# Patient Record
Sex: Female | Born: 1935 | Race: White | Hispanic: No | Smoking: Never smoker
Health system: Southern US, Community
[De-identification: ages and names within clinical notes are randomized; demographics above are authoritative.]

## PROBLEM LIST (undated history)

## (undated) DIAGNOSIS — K649 Unspecified hemorrhoids: Secondary | ICD-10-CM

## (undated) DIAGNOSIS — I10 Essential (primary) hypertension: Secondary | ICD-10-CM

## (undated) HISTORY — PX: TONSILLECTOMY: SUR1361

---

## 2004-06-24 ENCOUNTER — Ambulatory Visit: Payer: Self-pay | Admitting: Cardiology

## 2004-06-26 ENCOUNTER — Ambulatory Visit: Payer: Self-pay | Admitting: Internal Medicine

## 2004-06-30 ENCOUNTER — Ambulatory Visit: Payer: Self-pay | Admitting: Cardiology

## 2004-08-19 ENCOUNTER — Ambulatory Visit: Payer: Self-pay | Admitting: Cardiology

## 2004-08-25 ENCOUNTER — Inpatient Hospital Stay (HOSPITAL_BASED_OUTPATIENT_CLINIC_OR_DEPARTMENT_OTHER): Admission: RE | Admit: 2004-08-25 | Discharge: 2004-08-25 | Payer: Self-pay | Admitting: *Deleted

## 2004-08-25 ENCOUNTER — Ambulatory Visit: Payer: Self-pay | Admitting: Cardiology

## 2004-09-02 ENCOUNTER — Ambulatory Visit: Payer: Self-pay | Admitting: Cardiology

## 2008-05-18 ENCOUNTER — Ambulatory Visit (HOSPITAL_COMMUNITY): Admission: RE | Admit: 2008-05-18 | Discharge: 2008-05-18 | Payer: Self-pay | Admitting: Orthopaedic Surgery

## 2010-10-31 NOTE — Cardiovascular Report (Signed)
NAME:  Latoya Long, Latoya Long NO.:  [id number]   MEDICAL RECORD NO.:  [id number]           PATIENT TYPE:   LOCATION:                               FACILITY:  [facility]   PHYSICIAN:  Learta Codding, M.D. LHCDATE OF BIRTH:  1928-02-24   DATE OF PROCEDURE:  12/18/2004  DATE OF DISCHARGE:                              CARDIAC CATHETERIZATION   PROCEDURES:  1.  Left heart catheterization with selective coronary angiography.  2.  Ventriculography.   DIAGNOSIS:  No evidence of significant flow-limiting epicardial coronary  artery disease.   INDICATIONS:  The patient is a 77 year old female with a history of an  abnormal Cardiolite study. The patient has ongoing substernal chest pain and  has been admitted for diagnostic heart catheterization.   DESCRIPTION OF PROCEDURE:  After informed consent was obtained, the patient  was brought to the CV Catheter Lab. Right groin was sterilely prepped and  draped. A 4-French arterial sheath was placed using modified Seldinger  technique. Selective coronary angiography was performed in various  projections using manual injection of contrast. A 4-French pigtail catheter  was used for ventriculography. Power injection was used for  ventriculography. At the termination of the procedure, all catheter sheaths  were removed and no complications were encountered.   HEMODYNAMIC DATA:  1.  Left ventricular pressure 180/5 mmHg.  2.  Aortic pressure 180/85 mmHg.  3.  There was no gradient on coronary artery pullback.   VENTRICULOGRAPHY:  Ejection fraction 65% with segmental wall motion  abnormality in single plane RAO projection.   SELECTIVE CORONARY ANGIOGRAPHY:  1.  Left main coronary artery was a large caliber vessel with no evidence of      flow-limiting disease.  2.  LAD and the diagonal branch were free of significant flow-limiting      epicardial coronary artery disease.  3.  Circumflex coronary artery was a large caliber vessel. No  flow-limiting      lesions were seen and the marginal branches also had no focal stenoses.  4.  Right coronary artery again was a large caliber vessel terminating in a      PDA and a posterolateral branch. There were no flow-limiting lesions      noted.   RECOMMENDATIONS:  No evidence of significant flow-limiting epicardial  coronary artery disease. The patient appears to have a false-positive  Cardiolite stress study. Continue medical therapy, particularly with focus  on blood pressure control.       GED/MEDQ  D:  12/18/2004  T:  12/18/2004  Job:  [id number]

## 2013-11-06 ENCOUNTER — Emergency Department (HOSPITAL_COMMUNITY): Payer: Medicare HMO

## 2013-11-06 ENCOUNTER — Other Ambulatory Visit: Payer: Self-pay

## 2013-11-06 ENCOUNTER — Emergency Department (HOSPITAL_COMMUNITY)
Admission: EM | Admit: 2013-11-06 | Discharge: 2013-11-06 | Disposition: A | Discharge status: Home / Self Care | Payer: Medicare HMO | Attending: Emergency Medicine | Admitting: Emergency Medicine

## 2013-11-06 ENCOUNTER — Encounter (HOSPITAL_COMMUNITY): Payer: Self-pay | Admitting: Emergency Medicine

## 2013-11-06 DIAGNOSIS — Y9389 Activity, other specified: Secondary | ICD-10-CM | POA: Insufficient documentation

## 2013-11-06 DIAGNOSIS — W1809XA Striking against other object with subsequent fall, initial encounter: Secondary | ICD-10-CM | POA: Insufficient documentation

## 2013-11-06 DIAGNOSIS — I10 Essential (primary) hypertension: Secondary | ICD-10-CM | POA: Insufficient documentation

## 2013-11-06 DIAGNOSIS — W19XXXA Unspecified fall, initial encounter: Secondary | ICD-10-CM

## 2013-11-06 DIAGNOSIS — Y929 Unspecified place or not applicable: Secondary | ICD-10-CM | POA: Insufficient documentation

## 2013-11-06 DIAGNOSIS — S39011A Strain of muscle, fascia and tendon of abdomen, initial encounter: Secondary | ICD-10-CM

## 2013-11-06 DIAGNOSIS — S0990XA Unspecified injury of head, initial encounter: Secondary | ICD-10-CM | POA: Insufficient documentation

## 2013-11-06 DIAGNOSIS — IMO0002 Reserved for concepts with insufficient information to code with codable children: Secondary | ICD-10-CM | POA: Insufficient documentation

## 2013-11-06 HISTORY — DX: Essential (primary) hypertension: I10

## 2013-11-06 LAB — COMPREHENSIVE METABOLIC PANEL WITH GFR
ALT: 12 U/L (ref 0–35)
AST: 19 U/L (ref 0–37)
Albumin: 3.6 g/dL (ref 3.5–5.2)
Alkaline Phosphatase: 92 U/L (ref 39–117)
BUN: 16 mg/dL (ref 6–23)
CO2: 28 meq/L (ref 19–32)
Calcium: 9.1 mg/dL (ref 8.4–10.5)
Chloride: 100 meq/L (ref 96–112)
Creatinine, Ser: 0.74 mg/dL (ref 0.50–1.10)
GFR calc Af Amer: 89 mL/min — ABNORMAL LOW
GFR calc non Af Amer: 77 mL/min — ABNORMAL LOW
Glucose, Bld: 103 mg/dL — ABNORMAL HIGH (ref 70–99)
Potassium: 4.4 meq/L (ref 3.7–5.3)
Sodium: 138 meq/L (ref 137–147)
Total Bilirubin: 0.4 mg/dL (ref 0.3–1.2)
Total Protein: 7.2 g/dL (ref 6.0–8.3)

## 2013-11-06 LAB — CBC WITH DIFFERENTIAL/PLATELET
BASOS PCT: 0 % (ref 0–1)
Basophils Absolute: 0 10*3/uL (ref 0.0–0.1)
EOS ABS: 0.1 10*3/uL (ref 0.0–0.7)
EOS PCT: 2 % (ref 0–5)
HCT: 44.5 % (ref 36.0–46.0)
Hemoglobin: 14.5 g/dL (ref 12.0–15.0)
Lymphocytes Relative: 25 % (ref 12–46)
Lymphs Abs: 1.5 10*3/uL (ref 0.7–4.0)
MCH: 29.3 pg (ref 26.0–34.0)
MCHC: 32.6 g/dL (ref 30.0–36.0)
MCV: 89.9 fL (ref 78.0–100.0)
Monocytes Absolute: 0.6 10*3/uL (ref 0.1–1.0)
Monocytes Relative: 9 % (ref 3–12)
NEUTROS PCT: 64 % (ref 43–77)
Neutro Abs: 3.8 10*3/uL (ref 1.7–7.7)
PLATELETS: 217 10*3/uL (ref 150–400)
RBC: 4.95 MIL/uL (ref 3.87–5.11)
RDW: 14.8 % (ref 11.5–15.5)
WBC: 6 10*3/uL (ref 4.0–10.5)

## 2013-11-06 LAB — URINE MICROSCOPIC-ADD ON

## 2013-11-06 LAB — URINALYSIS, ROUTINE W REFLEX MICROSCOPIC
Bilirubin Urine: NEGATIVE
Glucose, UA: NEGATIVE mg/dL
Ketones, ur: NEGATIVE mg/dL
Leukocytes, UA: NEGATIVE
Nitrite: NEGATIVE
Protein, ur: NEGATIVE mg/dL
Specific Gravity, Urine: <1.005 — ABNORMAL LOW (ref 1.005–1.030)
Urobilinogen, UA: 0.2 mg/dL (ref 0.0–1.0)
pH: 6 (ref 5.0–8.0)

## 2013-11-06 LAB — LIPASE, BLOOD: Lipase: 31 U/L (ref 11–59)

## 2013-11-06 MED ORDER — IOHEXOL 300 MG/ML  SOLN
100.0000 mL | Freq: Once | INTRAMUSCULAR | Status: AC | PRN
  Administered 2013-11-06: 100 mL via INTRAVENOUS

## 2013-11-06 NOTE — ED Notes (Signed)
MD at bedside. 

## 2013-11-06 NOTE — ED Notes (Signed)
Pt fell backwards one week prior, states struck the back of her head and back, states she was fine with no pain until yesterday, now she feels weak, feels pulling pain in her abdomen and ribs.

## 2013-11-06 NOTE — ED Provider Notes (Signed)
CSN: [id number]     Arrival date & time 11/06/13  1007 History  This chart was scribed for Latoya Essex, MD by Roe Coombs, ED Scribe. The patient was seen in room APA01/APA01. Patient's care was started at 10:30 AM.  Chief Complaint  Patient presents with  . Fall    The history is provided by the patient. No language interpreter was used.    HPI Comments: Latoya Long is a 80 y.o. female who presents to the Emergency Department complaining of a fall that occurred 1 week ago. Patient reports that she was standing on a platform cutting hedges when she fell backwards hitting her left occipital head and left side. She is now complaining of pain in her left mid back since her fall 1 week ago. Pain is worse with cough and sneezing. Patient also says that after her fall, she began having left lower quadrant pain and she feels a pulling sensation with certain movements. Her last bowel movement was 2-3 days ago, but this is not unusual for her. She is also having some mild posterior neck and occipital head pain which she describes as "soreness" where she hit it upon falling. She has not taken any medicines for pain relief. Patient is able to ambulate. She was not dizzy or lightheaded when she fell. She denies loss of consciousness at the time of fall. Currently, she denies dysuria, hematuria, dizziness, lightheadedness, chest pain, nausea, vomiting, diarrhea, shortness of breath, numbness or weakness of the extremities or any other symptoms at this time. She has a history of hypertension and is on an antihypertensive. She is not on any anticoagulants.  PCP - Rory Percy   Past Medical History  Diagnosis Date  . Hypertension    Past Surgical History  Procedure Laterality Date  . Tonsillectomy     History reviewed. No pertinent family history. History  Substance Use Topics  . Smoking status: Never Smoker   . Smokeless tobacco: Not on file  . Alcohol Use: No   OB History   Grav Para Term  Preterm Abortions TAB SAB Ect Mult Living                 Review of Systems A complete 10 system review of systems was obtained and all systems are negative except as noted in the HPI and PMH.    Allergies  Review of patient's allergies indicates no known allergies.  Home Medications   Prior to Admission medications   Not on File   Triage Vitals: BP 172/80  Pulse 61  Temp(Src) 97.7 F (36.5 C)  Resp 18  Ht 5\' 4"  (1.626 m)  Wt 170 lb (77.111 kg)  BMI 29.17 kg/m2  SpO2 98% Physical Exam  Nursing note and vitals reviewed. Constitutional: She is oriented to person, place, and time. She appears well-developed and well-nourished.  HENT:  Head: Normocephalic and atraumatic.  Eyes: Conjunctivae and EOM are normal. Pupils are equal, round, and reactive to light.  Neck: Normal range of motion. Neck supple.  Cardiovascular: Normal rate, regular rhythm and normal heart sounds.  Exam reveals no gallop and no friction rub.   No murmur heard. Pulmonary/Chest: Effort normal and breath sounds normal. No respiratory distress. She has no wheezes. She has no rales.  No rib tenderness.  Abdominal: Soft. Bowel sounds are normal. There is tenderness. There is no rebound and no guarding.  Left sided abdominal tenderness. No ecchymosis.  Musculoskeletal: She exhibits tenderness.  No midline tenderness. Paraspinal cervical and lumbar  tenderness.  Neurological: She is alert and oriented to person, place, and time.  No ataxia on finger to nose bilaterally. No pronator drift. 5/5 strength throughout. CN 2-12 intact. Equal grip strength. Sensation intact. Gait is normal.  Skin: Skin is warm and dry.  Psychiatric: She has a normal mood and affect. Her behavior is normal.    ED Course  Procedures (including critical care time) DIAGNOSTIC STUDIES: Oxygen Saturation is 98% on room air, normal by my interpretation.    COORDINATION OF CARE: 10:40 AM- Patient presents after a fall one week ago.  Complaining of left abdominal pain, left back pain, neck soreness and head pain. Will order labs and x-rays of the chest, lumbar spine, and CT of the head, cervical spine, and abd/pelvis. Patient informed of current plan for treatment and evaluation and agrees with plan at this time.    Labs Review Labs Reviewed  URINALYSIS, ROUTINE W REFLEX MICROSCOPIC - Abnormal; Notable for the following:    Specific Gravity, Urine <1.005 (*)    Hgb urine dipstick TRACE (*)    All other components within normal limits  COMPREHENSIVE METABOLIC PANEL - Abnormal; Notable for the following:    Glucose, Bld 103 (*)    GFR calc non Af Amer 77 (*)    GFR calc Af Amer 89 (*)    All other components within normal limits  CBC WITH DIFFERENTIAL  LIPASE, BLOOD  URINE MICROSCOPIC-ADD ON    Imaging Review Dg Chest 2 View  11/06/2013   CLINICAL DATA:  Abdominal pain and mid back pain since a fall 1 week ago.  EXAM: CHEST  2 VIEW  COMPARISON:  None.  FINDINGS: The heart size and pulmonary vascularity are normal and the lungs are clear. There is slight accentuation of the thoracic kyphosis. There is an old compression fracture of the L1 vertebra.  IMPRESSION: No acute abnormalities.  Old compression fracture of L1.   Electronically Signed   By: Rozetta Nunnery M.D.   On: 11/06/2013 12:12   Dg Lumbar Spine Complete  11/06/2013   CLINICAL DATA:  Lower abdominal pain and mid back pain since a fall 1 week ago.  EXAM: LUMBAR SPINE - COMPLETE 4+ VIEW  COMPARISON:  CT scan dated 05/25 2015 and 04/29/2008  FINDINGS: There is an old compression fracture of L1. There is fairly severe degenerative disc disease at L3-4 and L4-5 and L5-S1. There are bilateral pars defects at L5 with a grade 1 spondylolisthesis at L5-S1. There has been no significant change since the prior study of 04/29/2008.  IMPRESSION: No acute abnormalities. Multilevel degenerative disc disease. Stable spondylolisthesis at L5-S1.   Electronically Signed   By: Rozetta Nunnery M.D.   On: 11/06/2013 12:53   Ct Head Wo Contrast  11/06/2013   CLINICAL DATA:  [place] backwards 1 week ago.  Head and neck pain.  EXAM: CT HEAD WITHOUT CONTRAST  CT CERVICAL SPINE WITHOUT CONTRAST  TECHNIQUE: Multidetector CT imaging of the head and cervical spine was performed following the standard protocol without intravenous contrast. Multiplanar CT image reconstructions of the cervical spine were also generated.  COMPARISON:  None.  FINDINGS: CT HEAD FINDINGS  Ventricles are normal in configuration. There is ventricular and sulcal enlargement reflecting age related volume loss. No hydrocephalus. Patchy white matter hypoattenuation is noted consistent with mild chronic microvascular ischemic change. No parenchymal masses or mass effect. No evidence of an infarct. There are no extra-axial masses or abnormal fluid collections. No intracranial hemorrhage. No skull  fracture. Visualized sinuses and mastoid air cells are clear.  CT CERVICAL SPINE FINDINGS  No fracture. No spondylolisthesis. There are disc and facet degenerative changes throughout the cervical spine. Bones are demineralized. The soft tissues show small thyroid nodules, sub cm, but are otherwise unremarkable. Lung apices are clear.  IMPRESSION: HEAD CT:  No acute intracranial abnormalities.  No skull fracture.  CERVICAL CT:  No fracture or acute finding.   Electronically Signed   By: Lajean Manes M.D.   On: 11/06/2013 12:15   Ct Cervical Spine Wo Contrast  11/06/2013   CLINICAL DATA:  [place] backwards 1 week ago.  Head and neck pain.  EXAM: CT HEAD WITHOUT CONTRAST  CT CERVICAL SPINE WITHOUT CONTRAST  TECHNIQUE: Multidetector CT imaging of the head and cervical spine was performed following the standard protocol without intravenous contrast. Multiplanar CT image reconstructions of the cervical spine were also generated.  COMPARISON:  None.  FINDINGS: CT HEAD FINDINGS  Ventricles are normal in configuration. There is ventricular and sulcal  enlargement reflecting age related volume loss. No hydrocephalus. Patchy white matter hypoattenuation is noted consistent with mild chronic microvascular ischemic change. No parenchymal masses or mass effect. No evidence of an infarct. There are no extra-axial masses or abnormal fluid collections. No intracranial hemorrhage. No skull fracture. Visualized sinuses and mastoid air cells are clear.  CT CERVICAL SPINE FINDINGS  No fracture. No spondylolisthesis. There are disc and facet degenerative changes throughout the cervical spine. Bones are demineralized. The soft tissues show small thyroid nodules, sub cm, but are otherwise unremarkable. Lung apices are clear.  IMPRESSION: HEAD CT:  No acute intracranial abnormalities.  No skull fracture.  CERVICAL CT:  No fracture or acute finding.   Electronically Signed   By: Lajean Manes M.D.   On: 11/06/2013 12:15   Ct Abdomen Pelvis W Contrast  11/06/2013   CLINICAL DATA:  Left lower abdominal pain and mid back pain secondary to a fall 1 week ago.  EXAM: CT ABDOMEN AND PELVIS WITH CONTRAST  TECHNIQUE: Multidetector CT imaging of the abdomen and pelvis was performed using the standard protocol following bolus administration of intravenous contrast.  CONTRAST:  14mL OMNIPAQUE IOHEXOL 300 MG/ML  SOLN  COMPARISON:  CT scan dated 04/29/2008  FINDINGS: The liver, biliary tree, spleen, pancreas, adrenal glands, and kidneys are normal. The bowel is normal including the terminal ileum and appendix. Uterus and ovaries are normal. No free air or free fluid.  There is an old compression fracture of the anterior superior aspect of L1. There is a stable grade 1 spondylolisthesis of L5 on S1 due to bilateral pars defects. There is degenerative disc disease at L1-2, L3-4, L4-5, and L5-S1. This appears stable.  IMPRESSION: No acute abnormalities of the abdomen.   Electronically Signed   By: Rozetta Nunnery M.D.   On: 11/06/2013 12:21    MDM   Final diagnoses:  Fall  Abdominal  muscle strain  Head injury   Patient states she lost her balance and fell backwards one week ago. She hit her head but did not lose consciousness. Complaining of head, neck and soreness on the left side of her abdomen with generalized weakness for the past one week. No focal neurological deficits. No blood thinner use.  Imaging is negative for acute traumatic injury. Scant hematuria on UA. Patient is neurovascular intact and able to ambulate. We'll treat her pain symptomatically with anti-inflammatories.   Date: 11/06/2013  Rate: 53  Rhythm: normal sinus rhythm  QRS  Axis: normal  Intervals: normal  ST/T Wave abnormalities: normal  Conduction Disutrbances:none  Narrative Interpretation:   Old EKG Reviewed: none available   I personally performed the services described in this documentation, which was scribed in my presence. The recorded information has been reviewed and is accurate.   Latoya Essex, MD 11/06/13 1535

## 2013-11-06 NOTE — ED Notes (Signed)
Pt ambulated in hallway to restroom independently. No c/o pain/dizziness/weakness. Pt tolerating cranberry juice po. nad noted.

## 2013-11-06 NOTE — Discharge Instructions (Signed)
Abdominal Pain, Adult There is no evidence of serious traumatic injury. Take Tylenol or ibuprofen as needed for pain. Followup withyour Dr. this week. Return to the ED for new or worsening symptoms. Many things can cause abdominal pain. Usually, abdominal pain is not caused by a disease and will improve without treatment. It can often be observed and treated at home. Your health care provider will do a physical exam and possibly order blood tests and X-rays to help determine the seriousness of your pain. However, in many cases, more time must pass before a clear cause of the pain can be found. Before that point, your health care provider may not know if you need more testing or further treatment. HOME CARE INSTRUCTIONS  Monitor your abdominal pain for any changes. The following actions may help to alleviate any discomfort you are experiencing:  Only take over-the-counter or prescription medicines as directed by your health care provider.  Do not take laxatives unless directed to do so by your health care provider.  Try a clear liquid diet (broth, tea, or water) as directed by your health care provider. Slowly move to a bland diet as tolerated. SEEK MEDICAL CARE IF:  You have unexplained abdominal pain.  You have abdominal pain associated with nausea or diarrhea.  You have pain when you urinate or have a bowel movement.  You experience abdominal pain that wakes you in the night.  You have abdominal pain that is worsened or improved by eating food.  You have abdominal pain that is worsened with eating fatty foods. SEEK IMMEDIATE MEDICAL CARE IF:   Your pain does not go away within 2 hours.  You have a fever.  You keep throwing up (vomiting).  Your pain is felt only in portions of the abdomen, such as the right side or the left lower portion of the abdomen.  You pass bloody or black tarry stools. MAKE SURE YOU:  Understand these instructions.   Will watch your condition.    Will get help right away if you are not doing well or get worse.  Document Released: 03/11/2005 Document Revised: 03/22/2013 Document Reviewed: 02/08/2013 [facility] Patient Information 2014 [place].

## 2015-06-13 ENCOUNTER — Encounter (HOSPITAL_COMMUNITY): Payer: Self-pay | Admitting: Emergency Medicine

## 2015-06-13 ENCOUNTER — Emergency Department (HOSPITAL_COMMUNITY): Payer: PPO

## 2015-06-13 ENCOUNTER — Emergency Department (HOSPITAL_COMMUNITY)
Admission: EM | Admit: 2015-06-13 | Discharge: 2015-06-13 | Disposition: A | Discharge status: Home / Self Care | Payer: PPO | Attending: Emergency Medicine | Admitting: Emergency Medicine

## 2015-06-13 DIAGNOSIS — R260 Ataxic gait: Secondary | ICD-10-CM | POA: Insufficient documentation

## 2015-06-13 DIAGNOSIS — Y9389 Activity, other specified: Secondary | ICD-10-CM | POA: Insufficient documentation

## 2015-06-13 DIAGNOSIS — Y92009 Unspecified place in unspecified non-institutional (private) residence as the place of occurrence of the external cause: Secondary | ICD-10-CM | POA: Insufficient documentation

## 2015-06-13 DIAGNOSIS — W19XXXA Unspecified fall, initial encounter: Secondary | ICD-10-CM

## 2015-06-13 DIAGNOSIS — S01112A Laceration without foreign body of left eyelid and periocular area, initial encounter: Secondary | ICD-10-CM | POA: Insufficient documentation

## 2015-06-13 DIAGNOSIS — Y998 Other external cause status: Secondary | ICD-10-CM | POA: Insufficient documentation

## 2015-06-13 DIAGNOSIS — Z79899 Other long term (current) drug therapy: Secondary | ICD-10-CM | POA: Insufficient documentation

## 2015-06-13 DIAGNOSIS — I10 Essential (primary) hypertension: Secondary | ICD-10-CM | POA: Insufficient documentation

## 2015-06-13 DIAGNOSIS — W01198A Fall on same level from slipping, tripping and stumbling with subsequent striking against other object, initial encounter: Secondary | ICD-10-CM | POA: Diagnosis not present

## 2015-06-13 DIAGNOSIS — S40012A Contusion of left shoulder, initial encounter: Secondary | ICD-10-CM | POA: Insufficient documentation

## 2015-06-13 MED ORDER — POVIDONE-IODINE 10 % EX SOLN
CUTANEOUS | Status: AC
  Administered 2015-06-13: 21:00:00
  Filled 2015-06-13: qty 118

## 2015-06-13 MED ORDER — TETANUS-DIPHTH-ACELL PERTUSSIS 5-2.5-18.5 LF-MCG/0.5 IM SUSP
0.5000 mL | Freq: Once | INTRAMUSCULAR | Status: AC
  Administered 2015-06-13: 0.5 mL via INTRAMUSCULAR
  Filled 2015-06-13: qty 0.5

## 2015-06-13 MED ORDER — LIDOCAINE-EPINEPHRINE (PF) 2 %-1:200000 IJ SOLN
20.0000 mL | Freq: Once | INTRAMUSCULAR | Status: AC
  Administered 2015-06-13: 20 mL
  Filled 2015-06-13: qty 20

## 2015-06-13 MED ORDER — DOUBLE ANTIBIOTIC 500-10000 UNIT/GM EX OINT
TOPICAL_OINTMENT | Freq: Once | CUTANEOUS | Status: DC
  Filled 2015-06-13: qty 1

## 2015-06-13 MED ORDER — ACETAMINOPHEN 325 MG PO TABS
650.0000 mg | ORAL_TABLET | Freq: Once | ORAL | Status: AC
  Administered 2015-06-13: 650 mg via ORAL
  Filled 2015-06-13: qty 2

## 2015-06-13 NOTE — ED Notes (Signed)
PT states she tripped and fell hitting her head on a chair and landing onto hardwood floor around 1.5 hrs ago. Laceration with bleeding controlled noted to left eyebrow and abrasion noted to left knee and pain to left shoulder.

## 2015-06-13 NOTE — ED Provider Notes (Signed)
CSN: [id number]     Arrival date & time 06/13/15  1539 History   First MD Initiated Contact with Patient 06/13/15 1942     Chief Complaint  Patient presents with  . Fall     (Consider location/radiation/quality/duration/timing/severity/associated sxs/prior Treatment) HPI Patient fell on her granddaughter's home today 12:15 PM she was walking without a cane and tripped striking her face. She complains of laceration at left eyebrow and left shoulder pain since the event. She denies neck pain denies loss of consciousness she felt well prior to the event. No treatment prior to coming here. No other associated symptoms Past Medical History  Diagnosis Date  . Hypertension    Past Surgical History  Procedure Laterality Date  . Tonsillectomy     History reviewed. No pertinent family history. Social History  Substance Use Topics  . Smoking status: Never Smoker   . Smokeless tobacco: None  . Alcohol Use: No   OB History    No data available     Review of Systems  Musculoskeletal: Positive for arthralgias and gait problem.       Walks with cane or walker chronically area and left shoulder pain  Skin: Positive for wound.       Laceration left eyebrow      Allergies  Review of patient's allergies indicates no known allergies.  Home Medications   Prior to Admission medications   Medication Sig Start Date End Date Taking? Authorizing Provider  bisoprolol-hydrochlorothiazide (ZIAC) 10-6.25 MG per tablet Take 1 tablet by mouth daily. 10/20/13  Yes Historical Provider, MD   BP 179/103 mmHg  Pulse 66  Temp(Src) 97.7 F (36.5 C) (Oral)  Resp 20  Ht 5\' 5"  (1.651 m)  Wt 173 lb (78.472 kg)  BMI 28.79 kg/m2  SpO2 94% Physical Exam  Constitutional: She appears well-developed and well-nourished.  HENT:  Head: Normocephalic and atraumatic.  Right Ear: External ear normal.  Left Ear: External ear normal.  1 cm irregular laceration at left eyebrow was a traumatic bilateral tympanic  membranes  Eyes: Conjunctivae are normal. Pupils are equal, round, and reactive to light.  Neck: Neck supple. No tracheal deviation present. No thyromegaly present.  Cardiovascular: Normal rate and regular rhythm.   No murmur heard. Pulmonary/Chest: Effort normal and breath sounds normal.  Abdominal: Soft. Bowel sounds are normal. She exhibits no distension. There is no tenderness.  Musculoskeletal: Normal range of motion. She exhibits no edema or tenderness.  Tyer spine nontender pelvis stable and nontender. Left upper extremity minimally tender at shoulder. Limited range of motion secondary to pain. No deformity no swelling no ecchymosis. Radial pulse 2+. Good capillary refill all other extremities or contusion abrasion or tenderness neurovascular intact  Neurological: She is alert. No cranial nerve deficit. Coordination normal.  Walks with minimal assistance  Skin: Skin is warm and dry. No rash noted.  Psychiatric: She has a normal mood and affect.  Nursing note and vitals reviewed.   ED Course  Procedures (including critical care time) Labs Review Labs Reviewed - No data to display  Imaging Review Ct Head Wo Contrast  06/13/2015  CLINICAL DATA:  Pain following fall EXAM: CT HEAD WITHOUT CONTRAST CT CERVICAL SPINE WITHOUT CONTRAST TECHNIQUE: Multidetector CT imaging of the head and cervical spine was performed following the standard protocol without intravenous contrast. Multiplanar CT image reconstructions of the cervical spine were also generated. COMPARISON:  CT head and CT cervical spine Nov 06, 2013 FINDINGS: CT HEAD FINDINGS Mild diffuse atrophy is stable.  There is no intracranial mass hemorrhage, extra-axial fluid collection, or midline shift. There is patchy small vessel disease in the centra semiovale bilaterally. There is no new gray-white compartment lesion. No acute infarct evident. The bony calvarium appears intact. The mastoid air cells are clear. There is opacification of an  ethmoid air cell on the left, mid portion. CT CERVICAL SPINE FINDINGS There is no fracture. Minimal anterolisthesis of C7 on T1 is stable and felt to be due to underlying spondylosis. There is no other spondylolisthesis. Prevertebral soft tissues and predental space regions are normal. There is moderate disc space narrowing at C3-4, C4-5, C5-6, and C6-7. There is facet hypertrophy at multiple levels bilaterally. There is exit foraminal narrowing due to bony hypertrophy at C3-4 bilaterally, C4-5 on the left, C5-6 on the right, and C6-7 bilaterally. No erosive change. IMPRESSION: CT head: Atrophy with periventricular small vessel disease. No intracranial mass, hemorrhage, or extra-axial fluid collection. No evident acute infarct. Small focus of left ethmoid sinus disease. CT cervical spine: No fracture. Minimal anterolisthesis of C7 on T1 is stable and felt to be due to underlying spondylosis. No other spondylolisthesis. Multilevel arthropathy. Electronically Signed   By: Lowella Grip III M.D.   On: 06/13/2015 16:43   Ct Cervical Spine Wo Contrast  06/13/2015  CLINICAL DATA:  Pain following fall EXAM: CT HEAD WITHOUT CONTRAST CT CERVICAL SPINE WITHOUT CONTRAST TECHNIQUE: Multidetector CT imaging of the head and cervical spine was performed following the standard protocol without intravenous contrast. Multiplanar CT image reconstructions of the cervical spine were also generated. COMPARISON:  CT head and CT cervical spine Nov 06, 2013 FINDINGS: CT HEAD FINDINGS Mild diffuse atrophy is stable. There is no intracranial mass hemorrhage, extra-axial fluid collection, or midline shift. There is patchy small vessel disease in the centra semiovale bilaterally. There is no new gray-white compartment lesion. No acute infarct evident. The bony calvarium appears intact. The mastoid air cells are clear. There is opacification of an ethmoid air cell on the left, mid portion. CT CERVICAL SPINE FINDINGS There is no fracture.  Minimal anterolisthesis of C7 on T1 is stable and felt to be due to underlying spondylosis. There is no other spondylolisthesis. Prevertebral soft tissues and predental space regions are normal. There is moderate disc space narrowing at C3-4, C4-5, C5-6, and C6-7. There is facet hypertrophy at multiple levels bilaterally. There is exit foraminal narrowing due to bony hypertrophy at C3-4 bilaterally, C4-5 on the left, C5-6 on the right, and C6-7 bilaterally. No erosive change. IMPRESSION: CT head: Atrophy with periventricular small vessel disease. No intracranial mass, hemorrhage, or extra-axial fluid collection. No evident acute infarct. Small focus of left ethmoid sinus disease. CT cervical spine: No fracture. Minimal anterolisthesis of C7 on T1 is stable and felt to be due to underlying spondylosis. No other spondylolisthesis. Multilevel arthropathy. Electronically Signed   By: Lowella Grip III M.D.   On: 06/13/2015 16:43   Dg Shoulder Left  06/13/2015  CLINICAL DATA:  Initial encounter for fell 4 today at home with pain in the left shoulder. EXAM: LEFT SHOULDER - 2+ VIEW COMPARISON:  None. FINDINGS: Three views study shows no evidence of fracture. Axillary view limited by difficulties with patient positioning, but no evidence for dislocation on the provided films. No evidence for gross shoulder separation. No worrisome lytic or sclerotic osseous abnormality. IMPRESSION: No acute bony findings. If symptoms persist or worsen, repeat imaging after Pain Management to allow better positioning may prove helpful. Electronically Signed   By:  Misty Stanley M.D.   On: 06/13/2015 16:31   I have personally reviewed and evaluated these images and lab results as part of my medical decision-making.   EKG Interpretation None     LACERATION REPAIR Performed by: Orlie Dakin Authorized by: Orlie Dakin Consent: Verbal consent obtained. Risks and benefits: risks, benefits and alternatives were  discussed Consent given by: patient Patient identity confirmed: provided demographic data Prepped and Draped in normal sterile fashion Wound explored  Laceration Location: left eyebrow  Laceration Length: 1cm  No Foreign Bodies seen or palpated  Anesthesia: local infiltration  Local anesthetic: lidocaine 2% with epinephrine  Anesthetic total: 1 ml  Irrigation method: syringe Amount of cleaning: standard  Skin closure: 5-0 prolene  Number of sutures: 3  Technique: simple interrupted  Patient tolerance: Patient tolerated the procedure well with no immediate complications. X-rays viewed by me. Results for orders placed or performed during the hospital encounter of 11/06/13  Urinalysis, Routine w reflex microscopic  Result Value Ref Range   Color, Urine YELLOW YELLOW   APPearance CLEAR CLEAR   Specific Gravity, Urine <1.005 (L) 1.005 - 1.030   pH 6.0 5.0 - 8.0   Glucose, UA NEGATIVE NEGATIVE mg/dL   Hgb urine dipstick TRACE (A) NEGATIVE   Bilirubin Urine NEGATIVE NEGATIVE   Ketones, ur NEGATIVE NEGATIVE mg/dL   Protein, ur NEGATIVE NEGATIVE mg/dL   Urobilinogen, UA 0.2 0.0 - 1.0 mg/dL   Nitrite NEGATIVE NEGATIVE   Leukocytes, UA NEGATIVE NEGATIVE  CBC with Differential  Result Value Ref Range   WBC 6.0 4.0 - 10.5 K/uL   RBC 4.95 3.87 - 5.11 MIL/uL   Hemoglobin 14.5 12.0 - 15.0 g/dL   HCT 44.5 36.0 - 46.0 %   MCV 89.9 78.0 - 100.0 fL   MCH 29.3 26.0 - 34.0 pg   MCHC 32.6 30.0 - 36.0 g/dL   RDW 14.8 11.5 - 15.5 %   Platelets 217 150 - 400 K/uL   Neutrophils Relative % 64 43 - 77 %   Neutro Abs 3.8 1.7 - 7.7 K/uL   Lymphocytes Relative 25 12 - 46 %   Lymphs Abs 1.5 0.7 - 4.0 K/uL   Monocytes Relative 9 3 - 12 %   Monocytes Absolute 0.6 0.1 - 1.0 K/uL   Eosinophils Relative 2 0 - 5 %   Eosinophils Absolute 0.1 0.0 - 0.7 K/uL   Basophils Relative 0 0 - 1 %   Basophils Absolute 0.0 0.0 - 0.1 K/uL  Comprehensive metabolic panel  Result Value Ref Range   Sodium  138 137 - 147 mEq/L   Potassium 4.4 3.7 - 5.3 mEq/L   Chloride 100 96 - 112 mEq/L   CO2 28 19 - 32 mEq/L   Glucose, Bld 103 (H) 70 - 99 mg/dL   BUN 16 6 - 23 mg/dL   Creatinine, Ser 0.74 0.50 - 1.10 mg/dL   Calcium 9.1 8.4 - 10.5 mg/dL   Total Protein 7.2 6.0 - 8.3 g/dL   Albumin 3.6 3.5 - 5.2 g/dL   AST 19 0 - 37 U/L   ALT 12 0 - 35 U/L   Alkaline Phosphatase 92 39 - 117 U/L   Total Bilirubin 0.4 0.3 - 1.2 mg/dL   GFR calc non Af Amer 77 (L) >90 mL/min   GFR calc Af Amer 89 (L) >90 mL/min  Lipase, blood  Result Value Ref Range   Lipase 31 11 - 59 U/L  Urine microscopic-add on  Result Value Ref Range  RBC / HPF 0-2 <3 RBC/hpf   Ct Head Wo Contrast  06/13/2015  CLINICAL DATA:  Pain following fall EXAM: CT HEAD WITHOUT CONTRAST CT CERVICAL SPINE WITHOUT CONTRAST TECHNIQUE: Multidetector CT imaging of the head and cervical spine was performed following the standard protocol without intravenous contrast. Multiplanar CT image reconstructions of the cervical spine were also generated. COMPARISON:  CT head and CT cervical spine Nov 06, 2013 FINDINGS: CT HEAD FINDINGS Mild diffuse atrophy is stable. There is no intracranial mass hemorrhage, extra-axial fluid collection, or midline shift. There is patchy small vessel disease in the centra semiovale bilaterally. There is no new gray-white compartment lesion. No acute infarct evident. The bony calvarium appears intact. The mastoid air cells are clear. There is opacification of an ethmoid air cell on the left, mid portion. CT CERVICAL SPINE FINDINGS There is no fracture. Minimal anterolisthesis of C7 on T1 is stable and felt to be due to underlying spondylosis. There is no other spondylolisthesis. Prevertebral soft tissues and predental space regions are normal. There is moderate disc space narrowing at C3-4, C4-5, C5-6, and C6-7. There is facet hypertrophy at multiple levels bilaterally. There is exit foraminal narrowing due to bony hypertrophy at  C3-4 bilaterally, C4-5 on the left, C5-6 on the right, and C6-7 bilaterally. No erosive change. IMPRESSION: CT head: Atrophy with periventricular small vessel disease. No intracranial mass, hemorrhage, or extra-axial fluid collection. No evident acute infarct. Small focus of left ethmoid sinus disease. CT cervical spine: No fracture. Minimal anterolisthesis of C7 on T1 is stable and felt to be due to underlying spondylosis. No other spondylolisthesis. Multilevel arthropathy. Electronically Signed   By: Lowella Grip III M.D.   On: 06/13/2015 16:43   Ct Cervical Spine Wo Contrast  06/13/2015  CLINICAL DATA:  Pain following fall EXAM: CT HEAD WITHOUT CONTRAST CT CERVICAL SPINE WITHOUT CONTRAST TECHNIQUE: Multidetector CT imaging of the head and cervical spine was performed following the standard protocol without intravenous contrast. Multiplanar CT image reconstructions of the cervical spine were also generated. COMPARISON:  CT head and CT cervical spine Nov 06, 2013 FINDINGS: CT HEAD FINDINGS Mild diffuse atrophy is stable. There is no intracranial mass hemorrhage, extra-axial fluid collection, or midline shift. There is patchy small vessel disease in the centra semiovale bilaterally. There is no new gray-white compartment lesion. No acute infarct evident. The bony calvarium appears intact. The mastoid air cells are clear. There is opacification of an ethmoid air cell on the left, mid portion. CT CERVICAL SPINE FINDINGS There is no fracture. Minimal anterolisthesis of C7 on T1 is stable and felt to be due to underlying spondylosis. There is no other spondylolisthesis. Prevertebral soft tissues and predental space regions are normal. There is moderate disc space narrowing at C3-4, C4-5, C5-6, and C6-7. There is facet hypertrophy at multiple levels bilaterally. There is exit foraminal narrowing due to bony hypertrophy at C3-4 bilaterally, C4-5 on the left, C5-6 on the right, and C6-7 bilaterally. No erosive  change. IMPRESSION: CT head: Atrophy with periventricular small vessel disease. No intracranial mass, hemorrhage, or extra-axial fluid collection. No evident acute infarct. Small focus of left ethmoid sinus disease. CT cervical spine: No fracture. Minimal anterolisthesis of C7 on T1 is stable and felt to be due to underlying spondylosis. No other spondylolisthesis. Multilevel arthropathy. Electronically Signed   By: Lowella Grip III M.D.   On: 06/13/2015 16:43   Dg Shoulder Left  06/13/2015  CLINICAL DATA:  Initial encounter for fell 4 today at home  with pain in the left shoulder. EXAM: LEFT SHOULDER - 2+ VIEW COMPARISON:  None. FINDINGS: Three views study shows no evidence of fracture. Axillary view limited by difficulties with patient positioning, but no evidence for dislocation on the provided films. No evidence for gross shoulder separation. No worrisome lytic or sclerotic osseous abnormality. IMPRESSION: No acute bony findings. If symptoms persist or worsen, repeat imaging after Pain Management to allow better positioning may prove helpful. Electronically Signed   By: Misty Stanley M.D.   On: 06/13/2015 16:31    MDM   Final diagnoses:  None    Tylenol for pain. Local wound care. Sutures out 5 days. I don't think clinically the patient has fracture of left shoulder and need for further imaging  Diagnosis #1 fall #2 laceration of face #3 contusion of left shoulder     Orlie Dakin, MD 06/13/15 2043

## 2015-06-13 NOTE — Discharge Instructions (Signed)
Take Tylenol as directed for pain. Wash the wound on your face daily with soap and water and place a thin layer of antibiotic ointment over the wound after washing. Signs of infection including redness, swelling, more pain or drainage from the wound or fever. Sutures can be taken up by your primary care physician in 5 days.

## 2015-06-19 DIAGNOSIS — Z4802 Encounter for removal of sutures: Secondary | ICD-10-CM | POA: Diagnosis not present

## 2015-07-10 DIAGNOSIS — S92911S Unspecified fracture of right toe(s), sequela: Secondary | ICD-10-CM | POA: Diagnosis not present

## 2016-03-06 DIAGNOSIS — D519 Vitamin B12 deficiency anemia, unspecified: Secondary | ICD-10-CM | POA: Diagnosis not present

## 2016-03-06 DIAGNOSIS — E559 Vitamin D deficiency, unspecified: Secondary | ICD-10-CM | POA: Diagnosis not present

## 2016-03-06 DIAGNOSIS — E7800 Pure hypercholesterolemia, unspecified: Secondary | ICD-10-CM | POA: Diagnosis not present

## 2016-03-06 DIAGNOSIS — I10 Essential (primary) hypertension: Secondary | ICD-10-CM | POA: Diagnosis not present

## 2016-03-13 DIAGNOSIS — Z23 Encounter for immunization: Secondary | ICD-10-CM | POA: Diagnosis not present

## 2016-03-13 DIAGNOSIS — G20 Parkinson's disease: Secondary | ICD-10-CM | POA: Diagnosis not present

## 2016-03-13 DIAGNOSIS — E559 Vitamin D deficiency, unspecified: Secondary | ICD-10-CM | POA: Diagnosis not present

## 2016-03-13 DIAGNOSIS — Z0000 Encounter for general adult medical examination without abnormal findings: Secondary | ICD-10-CM | POA: Diagnosis not present

## 2016-03-13 DIAGNOSIS — R260 Ataxic gait: Secondary | ICD-10-CM | POA: Diagnosis not present

## 2016-03-13 DIAGNOSIS — I10 Essential (primary) hypertension: Secondary | ICD-10-CM | POA: Diagnosis not present

## 2016-03-13 DIAGNOSIS — Z6830 Body mass index (BMI) 30.0-30.9, adult: Secondary | ICD-10-CM | POA: Diagnosis not present

## 2016-03-16 DIAGNOSIS — R260 Ataxic gait: Secondary | ICD-10-CM | POA: Diagnosis not present

## 2016-03-16 DIAGNOSIS — G20 Parkinson's disease: Secondary | ICD-10-CM | POA: Diagnosis not present

## 2016-03-30 DIAGNOSIS — Z1231 Encounter for screening mammogram for malignant neoplasm of breast: Secondary | ICD-10-CM | POA: Diagnosis not present

## 2016-06-22 DIAGNOSIS — Z6829 Body mass index (BMI) 29.0-29.9, adult: Secondary | ICD-10-CM | POA: Diagnosis not present

## 2016-06-22 DIAGNOSIS — N904 Leukoplakia of vulva: Secondary | ICD-10-CM | POA: Diagnosis not present

## 2016-06-22 DIAGNOSIS — L900 Lichen sclerosus et atrophicus: Secondary | ICD-10-CM | POA: Diagnosis not present

## 2016-09-09 DIAGNOSIS — S90119A Contusion of unspecified great toe without damage to nail, initial encounter: Secondary | ICD-10-CM | POA: Diagnosis not present

## 2016-09-09 DIAGNOSIS — M79672 Pain in left foot: Secondary | ICD-10-CM | POA: Diagnosis not present

## 2016-10-01 DIAGNOSIS — Z6830 Body mass index (BMI) 30.0-30.9, adult: Secondary | ICD-10-CM | POA: Diagnosis not present

## 2016-10-01 DIAGNOSIS — L719 Rosacea, unspecified: Secondary | ICD-10-CM | POA: Diagnosis not present

## 2017-02-08 DIAGNOSIS — R300 Dysuria: Secondary | ICD-10-CM | POA: Diagnosis not present

## 2017-02-08 DIAGNOSIS — R1030 Lower abdominal pain, unspecified: Secondary | ICD-10-CM | POA: Diagnosis not present

## 2017-02-17 DIAGNOSIS — Z6830 Body mass index (BMI) 30.0-30.9, adult: Secondary | ICD-10-CM | POA: Diagnosis not present

## 2017-02-17 DIAGNOSIS — L719 Rosacea, unspecified: Secondary | ICD-10-CM | POA: Diagnosis not present

## 2017-02-17 DIAGNOSIS — I10 Essential (primary) hypertension: Secondary | ICD-10-CM | POA: Diagnosis not present

## 2017-03-01 DIAGNOSIS — L0100 Impetigo, unspecified: Secondary | ICD-10-CM | POA: Diagnosis not present

## 2017-03-01 DIAGNOSIS — L719 Rosacea, unspecified: Secondary | ICD-10-CM | POA: Diagnosis not present

## 2017-03-01 DIAGNOSIS — L720 Epidermal cyst: Secondary | ICD-10-CM | POA: Diagnosis not present

## 2017-03-01 DIAGNOSIS — D485 Neoplasm of uncertain behavior of skin: Secondary | ICD-10-CM | POA: Diagnosis not present

## 2017-03-11 DIAGNOSIS — L719 Rosacea, unspecified: Secondary | ICD-10-CM | POA: Diagnosis not present

## 2017-03-23 DIAGNOSIS — G20 Parkinson's disease: Secondary | ICD-10-CM | POA: Diagnosis not present

## 2017-03-23 DIAGNOSIS — E559 Vitamin D deficiency, unspecified: Secondary | ICD-10-CM | POA: Diagnosis not present

## 2017-03-23 DIAGNOSIS — I10 Essential (primary) hypertension: Secondary | ICD-10-CM | POA: Diagnosis not present

## 2017-03-23 DIAGNOSIS — E7800 Pure hypercholesterolemia, unspecified: Secondary | ICD-10-CM | POA: Diagnosis not present

## 2017-03-23 DIAGNOSIS — D519 Vitamin B12 deficiency anemia, unspecified: Secondary | ICD-10-CM | POA: Diagnosis not present

## 2017-03-29 DIAGNOSIS — Z6830 Body mass index (BMI) 30.0-30.9, adult: Secondary | ICD-10-CM | POA: Diagnosis not present

## 2017-03-29 DIAGNOSIS — G20 Parkinson's disease: Secondary | ICD-10-CM | POA: Diagnosis not present

## 2017-03-29 DIAGNOSIS — E7800 Pure hypercholesterolemia, unspecified: Secondary | ICD-10-CM | POA: Diagnosis not present

## 2017-03-29 DIAGNOSIS — E559 Vitamin D deficiency, unspecified: Secondary | ICD-10-CM | POA: Diagnosis not present

## 2017-03-29 DIAGNOSIS — D519 Vitamin B12 deficiency anemia, unspecified: Secondary | ICD-10-CM | POA: Diagnosis not present

## 2017-03-29 DIAGNOSIS — L719 Rosacea, unspecified: Secondary | ICD-10-CM | POA: Diagnosis not present

## 2017-03-29 DIAGNOSIS — Z0001 Encounter for general adult medical examination with abnormal findings: Secondary | ICD-10-CM | POA: Diagnosis not present

## 2017-03-29 DIAGNOSIS — Z23 Encounter for immunization: Secondary | ICD-10-CM | POA: Diagnosis not present

## 2017-04-12 DIAGNOSIS — L719 Rosacea, unspecified: Secondary | ICD-10-CM | POA: Diagnosis not present

## 2017-04-29 DIAGNOSIS — L900 Lichen sclerosus et atrophicus: Secondary | ICD-10-CM | POA: Diagnosis not present

## 2017-06-24 DIAGNOSIS — J0101 Acute recurrent maxillary sinusitis: Secondary | ICD-10-CM | POA: Diagnosis not present

## 2017-06-24 DIAGNOSIS — Z6829 Body mass index (BMI) 29.0-29.9, adult: Secondary | ICD-10-CM | POA: Diagnosis not present

## 2017-07-18 IMAGING — CT CT HEAD W/O CM
3 of 6 series · 12 of 47 positions shown, 14 images · non-contrast
Comparison: CT head and CT cervical spine November 06, 2013

CLINICAL DATA: Pain following fall

EXAM:
CT HEAD WITHOUT CONTRAST
CT CERVICAL SPINE WITHOUT CONTRAST
TECHNIQUE: Multidetector CT imaging of the head and cervical spine was
performed following the standard protocol without intravenous
contrast. Multiplanar CT image reconstructions of the cervical spine
were also generated.

[Series 3: headseq 2.4 h60s · axial · 0.47mm/px · z∈[+226,+353]mm · 6 of 72 slices shown, 8 images]
[im 11/72  brain]
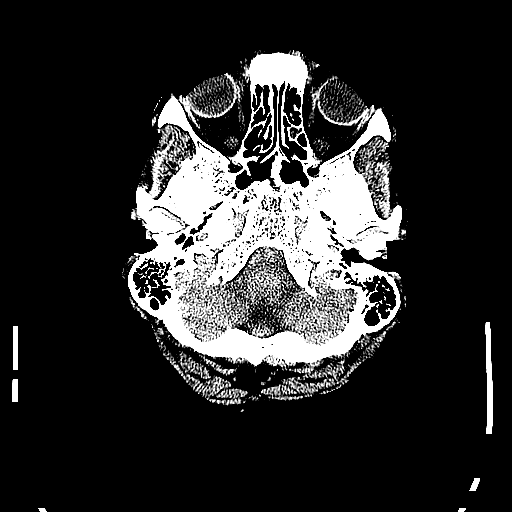
[im 11/72  bone]
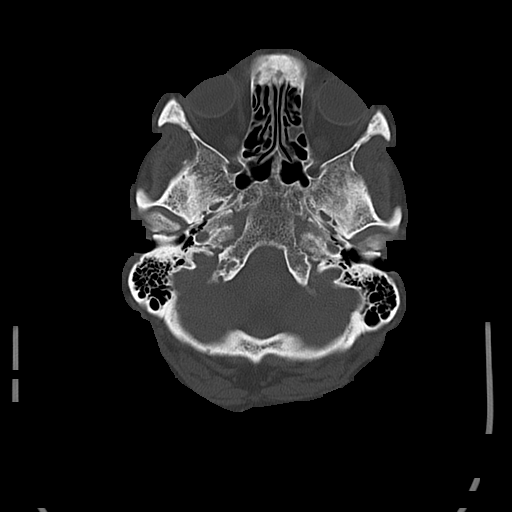
[im 21/72  brain]
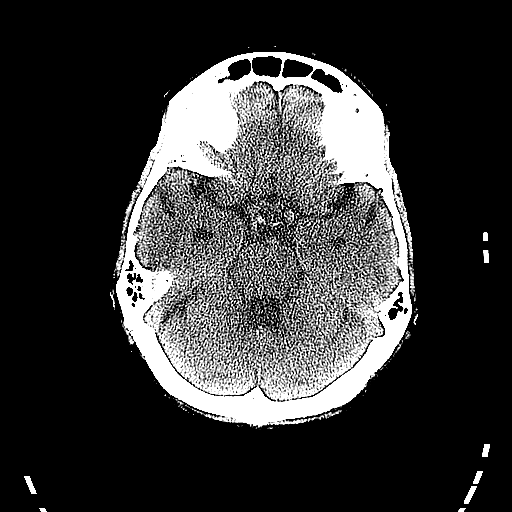
[im 31/72  brain]
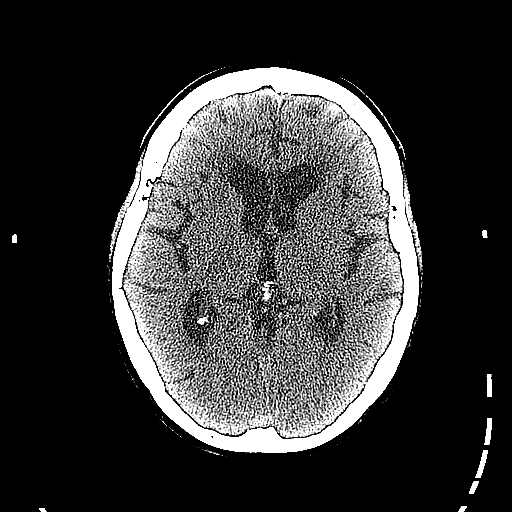
[im 41/72  brain]
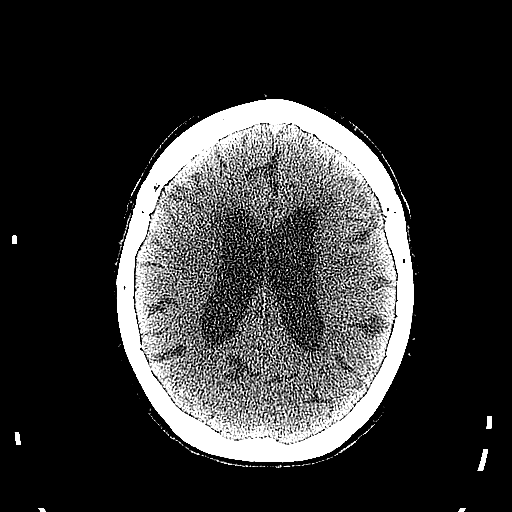
[im 51/72  brain]
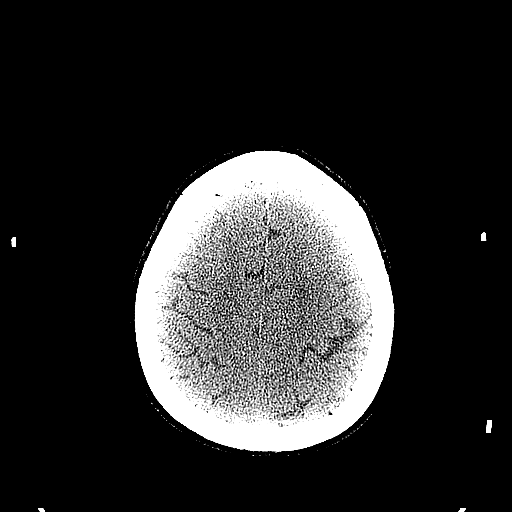
[im 51/72  bone]
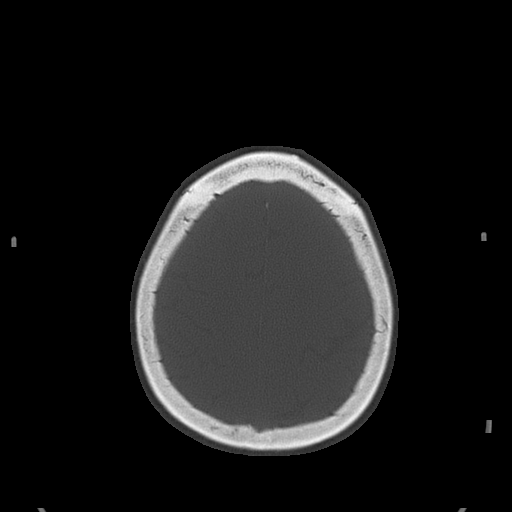
[im 61/72  brain]
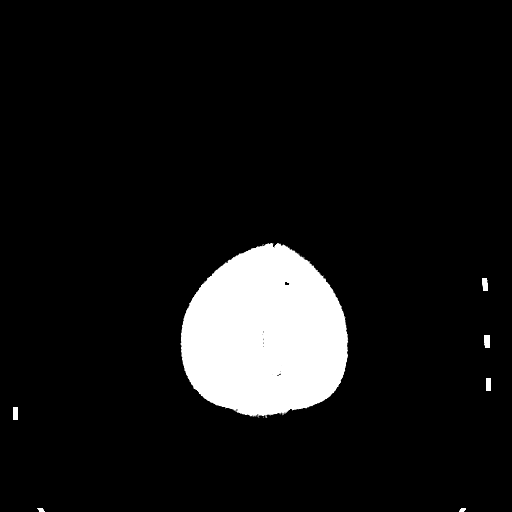

[Series 7: sagittal bone 2.0 · sagittal · 0.19mm/px · 3 of 47 slices shown]
[im 16/47  brain]
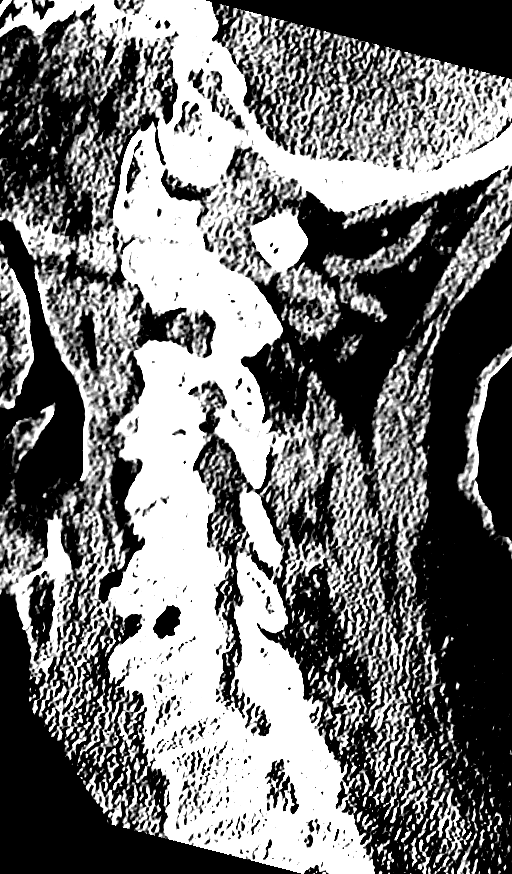
[im 24/47  brain]
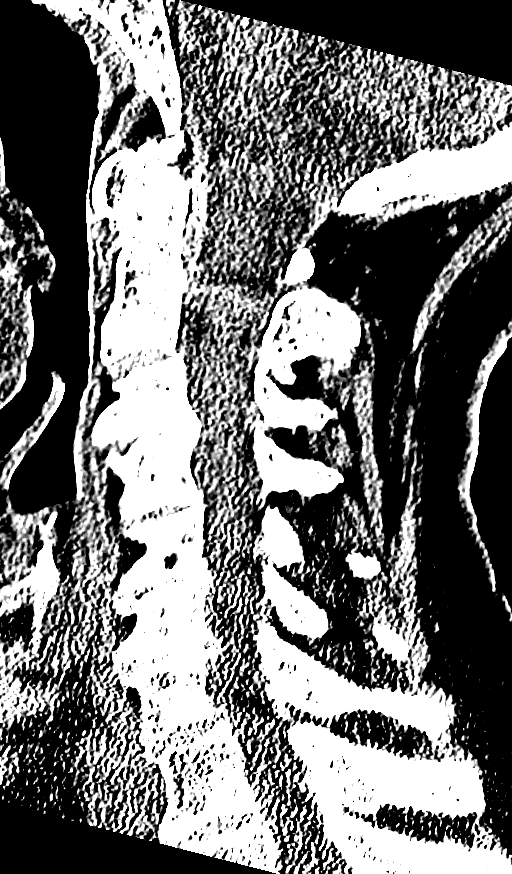
[im 31/47  brain]
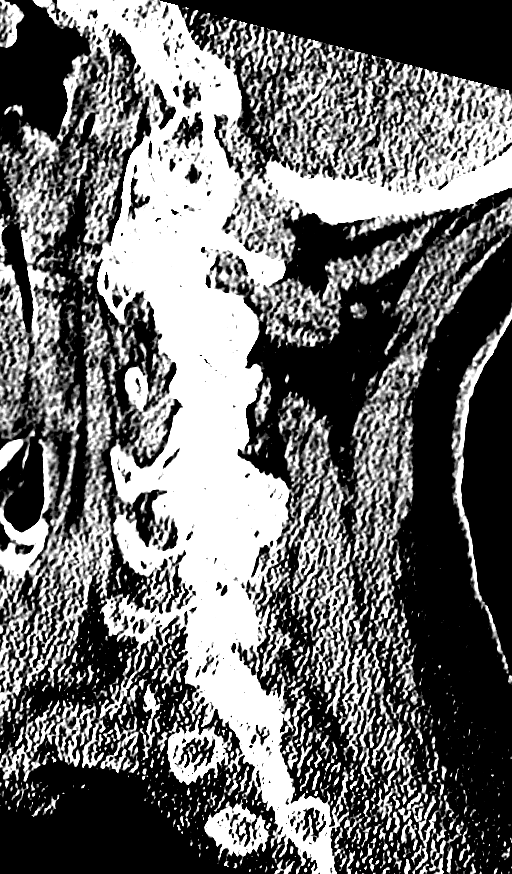

[Series 8: coronal bone 2.0 · coronal · 0.22mm/px · 3 of 53 slices shown]
[im 18/53  brain]
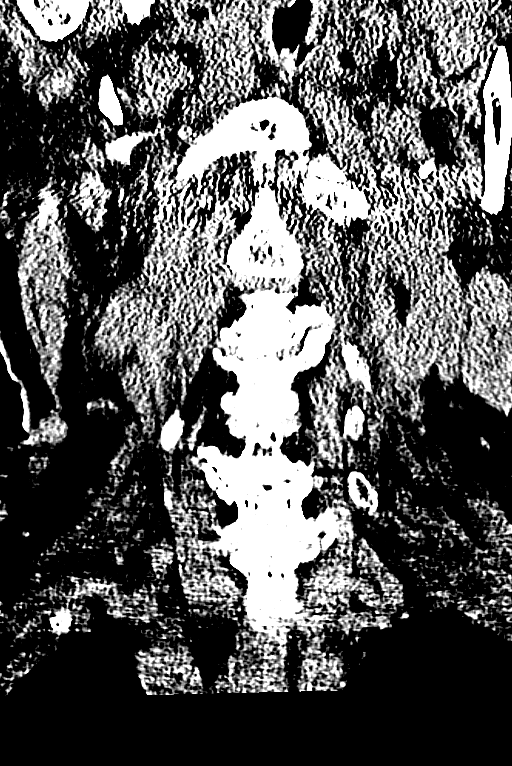
[im 24/53  brain]
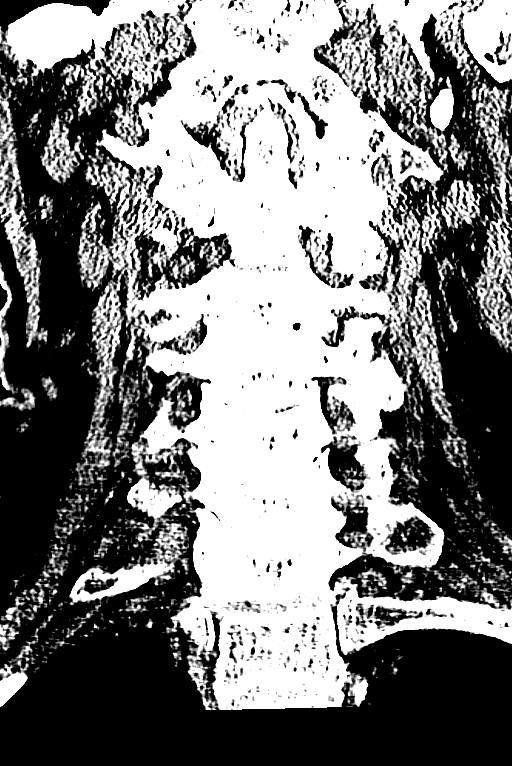
[im 29/53  brain]
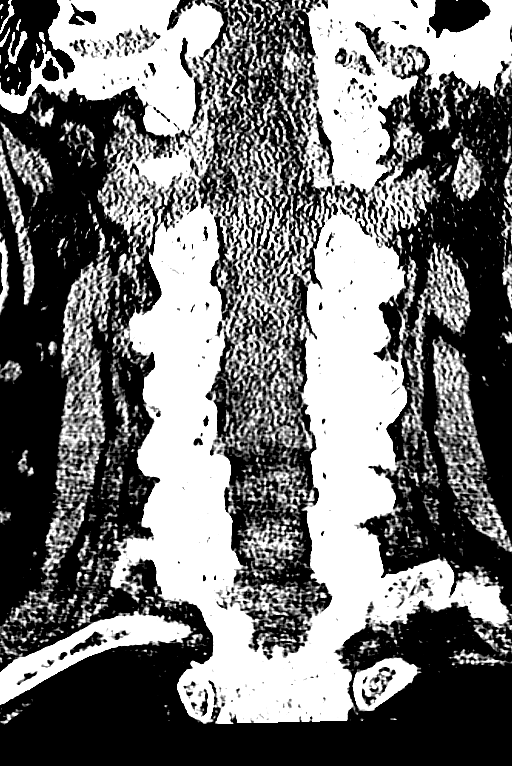

[12 of 47 positions shown; findings below may reference images not displayed]

FINDINGS: CT HEAD FINDINGS

Mild diffuse atrophy is stable. There is no intracranial mass
hemorrhage, extra-axial fluid collection, or midline shift. There is
patchy small vessel disease in the centra semiovale bilaterally.
There is no new gray-white compartment lesion. No acute infarct
evident. The bony calvarium appears intact. The mastoid air cells
are clear. There is opacification of an ethmoid air cell on the
left, mid portion.

CT CERVICAL SPINE FINDINGS

There is no fracture. Minimal anterolisthesis of C7 on T1 is stable
and felt to be due to underlying spondylosis. There is no other
spondylolisthesis. Prevertebral soft tissues and predental space
regions are normal. There is moderate disc space narrowing at C3-4,
C4-5, C5-6, and C6-7. There is facet hypertrophy at multiple levels
bilaterally. There is exit foraminal narrowing due to bony
hypertrophy at C3-4 bilaterally, C4-5 on the left, C5-6 on the
right, and C6-7 bilaterally. No erosive change.
IMPRESSION: CT head: Atrophy with periventricular small vessel disease. No
intracranial mass, hemorrhage, or extra-axial fluid collection. No
evident acute infarct. Small focus of left ethmoid sinus disease.

CT cervical spine: No fracture. Minimal anterolisthesis of C7 on T1
is stable and felt to be due to underlying spondylosis. No other
spondylolisthesis. Multilevel arthropathy.

## 2017-10-29 DIAGNOSIS — N390 Urinary tract infection, site not specified: Secondary | ICD-10-CM | POA: Diagnosis not present

## 2017-10-29 DIAGNOSIS — Z6829 Body mass index (BMI) 29.0-29.9, adult: Secondary | ICD-10-CM | POA: Diagnosis not present

## 2017-12-21 DIAGNOSIS — H1045 Other chronic allergic conjunctivitis: Secondary | ICD-10-CM | POA: Diagnosis not present

## 2018-03-01 DIAGNOSIS — L900 Lichen sclerosus et atrophicus: Secondary | ICD-10-CM | POA: Diagnosis not present

## 2018-03-01 DIAGNOSIS — N814 Uterovaginal prolapse, unspecified: Secondary | ICD-10-CM | POA: Diagnosis not present

## 2018-03-22 DIAGNOSIS — J309 Allergic rhinitis, unspecified: Secondary | ICD-10-CM | POA: Diagnosis not present

## 2018-03-22 DIAGNOSIS — J0100 Acute maxillary sinusitis, unspecified: Secondary | ICD-10-CM | POA: Diagnosis not present

## 2018-03-22 DIAGNOSIS — Z6829 Body mass index (BMI) 29.0-29.9, adult: Secondary | ICD-10-CM | POA: Diagnosis not present

## 2018-04-12 DIAGNOSIS — D519 Vitamin B12 deficiency anemia, unspecified: Secondary | ICD-10-CM | POA: Diagnosis not present

## 2018-04-12 DIAGNOSIS — E559 Vitamin D deficiency, unspecified: Secondary | ICD-10-CM | POA: Diagnosis not present

## 2018-04-12 DIAGNOSIS — G20 Parkinson's disease: Secondary | ICD-10-CM | POA: Diagnosis not present

## 2018-04-12 DIAGNOSIS — I10 Essential (primary) hypertension: Secondary | ICD-10-CM | POA: Diagnosis not present

## 2018-04-14 DIAGNOSIS — Z6828 Body mass index (BMI) 28.0-28.9, adult: Secondary | ICD-10-CM | POA: Diagnosis not present

## 2018-04-14 DIAGNOSIS — Z23 Encounter for immunization: Secondary | ICD-10-CM | POA: Diagnosis not present

## 2018-04-14 DIAGNOSIS — E559 Vitamin D deficiency, unspecified: Secondary | ICD-10-CM | POA: Diagnosis not present

## 2018-04-14 DIAGNOSIS — L719 Rosacea, unspecified: Secondary | ICD-10-CM | POA: Diagnosis not present

## 2018-04-14 DIAGNOSIS — D519 Vitamin B12 deficiency anemia, unspecified: Secondary | ICD-10-CM | POA: Diagnosis not present

## 2018-04-14 DIAGNOSIS — E7800 Pure hypercholesterolemia, unspecified: Secondary | ICD-10-CM | POA: Diagnosis not present

## 2018-04-14 DIAGNOSIS — G20 Parkinson's disease: Secondary | ICD-10-CM | POA: Diagnosis not present

## 2018-04-14 DIAGNOSIS — Z0001 Encounter for general adult medical examination with abnormal findings: Secondary | ICD-10-CM | POA: Diagnosis not present

## 2018-04-27 DIAGNOSIS — Z6828 Body mass index (BMI) 28.0-28.9, adult: Secondary | ICD-10-CM | POA: Diagnosis not present

## 2018-04-27 DIAGNOSIS — Z0000 Encounter for general adult medical examination without abnormal findings: Secondary | ICD-10-CM | POA: Diagnosis not present

## 2018-04-27 DIAGNOSIS — I10 Essential (primary) hypertension: Secondary | ICD-10-CM | POA: Diagnosis not present

## 2018-04-27 DIAGNOSIS — Z23 Encounter for immunization: Secondary | ICD-10-CM | POA: Diagnosis not present

## 2018-08-30 DIAGNOSIS — I739 Peripheral vascular disease, unspecified: Secondary | ICD-10-CM | POA: Diagnosis not present

## 2018-08-30 DIAGNOSIS — M79672 Pain in left foot: Secondary | ICD-10-CM | POA: Diagnosis not present

## 2018-08-30 DIAGNOSIS — M79671 Pain in right foot: Secondary | ICD-10-CM | POA: Diagnosis not present

## 2019-04-10 DIAGNOSIS — D519 Vitamin B12 deficiency anemia, unspecified: Secondary | ICD-10-CM | POA: Diagnosis not present

## 2019-04-10 DIAGNOSIS — I10 Essential (primary) hypertension: Secondary | ICD-10-CM | POA: Diagnosis not present

## 2019-04-10 DIAGNOSIS — G20 Parkinson's disease: Secondary | ICD-10-CM | POA: Diagnosis not present

## 2019-04-10 DIAGNOSIS — E559 Vitamin D deficiency, unspecified: Secondary | ICD-10-CM | POA: Diagnosis not present

## 2019-04-17 DIAGNOSIS — Z6829 Body mass index (BMI) 29.0-29.9, adult: Secondary | ICD-10-CM | POA: Diagnosis not present

## 2019-04-17 DIAGNOSIS — E559 Vitamin D deficiency, unspecified: Secondary | ICD-10-CM | POA: Diagnosis not present

## 2019-04-17 DIAGNOSIS — Z23 Encounter for immunization: Secondary | ICD-10-CM | POA: Diagnosis not present

## 2019-04-17 DIAGNOSIS — Z0001 Encounter for general adult medical examination with abnormal findings: Secondary | ICD-10-CM | POA: Diagnosis not present

## 2019-04-17 DIAGNOSIS — D519 Vitamin B12 deficiency anemia, unspecified: Secondary | ICD-10-CM | POA: Diagnosis not present

## 2019-04-17 DIAGNOSIS — R251 Tremor, unspecified: Secondary | ICD-10-CM | POA: Diagnosis not present

## 2019-04-17 DIAGNOSIS — E7800 Pure hypercholesterolemia, unspecified: Secondary | ICD-10-CM | POA: Diagnosis not present

## 2020-04-08 DIAGNOSIS — R110 Nausea: Secondary | ICD-10-CM | POA: Diagnosis not present

## 2020-04-08 DIAGNOSIS — J0101 Acute recurrent maxillary sinusitis: Secondary | ICD-10-CM | POA: Diagnosis not present

## 2020-04-27 DIAGNOSIS — R58 Hemorrhage, not elsewhere classified: Secondary | ICD-10-CM | POA: Diagnosis not present

## 2020-04-27 DIAGNOSIS — R52 Pain, unspecified: Secondary | ICD-10-CM | POA: Diagnosis not present

## 2020-04-27 DIAGNOSIS — R0902 Hypoxemia: Secondary | ICD-10-CM | POA: Diagnosis not present

## 2020-04-27 DIAGNOSIS — R0689 Other abnormalities of breathing: Secondary | ICD-10-CM | POA: Diagnosis not present

## 2020-04-27 DIAGNOSIS — K529 Noninfective gastroenteritis and colitis, unspecified: Secondary | ICD-10-CM | POA: Diagnosis not present

## 2020-04-27 DIAGNOSIS — R109 Unspecified abdominal pain: Secondary | ICD-10-CM | POA: Diagnosis not present

## 2020-04-27 DIAGNOSIS — K5900 Constipation, unspecified: Secondary | ICD-10-CM | POA: Diagnosis not present

## 2020-04-27 DIAGNOSIS — I10 Essential (primary) hypertension: Secondary | ICD-10-CM | POA: Diagnosis not present

## 2020-04-27 DIAGNOSIS — J811 Chronic pulmonary edema: Secondary | ICD-10-CM | POA: Diagnosis not present

## 2020-04-27 DIAGNOSIS — A0472 Enterocolitis due to Clostridium difficile, not specified as recurrent: Secondary | ICD-10-CM | POA: Diagnosis not present

## 2020-04-27 DIAGNOSIS — E860 Dehydration: Secondary | ICD-10-CM | POA: Diagnosis not present

## 2020-04-28 ENCOUNTER — Other Ambulatory Visit: Payer: Self-pay

## 2020-04-28 ENCOUNTER — Encounter (HOSPITAL_COMMUNITY): Payer: Self-pay | Admitting: *Deleted

## 2020-04-28 ENCOUNTER — Emergency Department (HOSPITAL_COMMUNITY): Payer: PPO

## 2020-04-28 ENCOUNTER — Inpatient Hospital Stay (HOSPITAL_COMMUNITY): Payer: PPO

## 2020-04-28 ENCOUNTER — Inpatient Hospital Stay (HOSPITAL_COMMUNITY)
Admission: EM | Admit: 2020-04-28 | Discharge: 2020-05-04 | DRG: 393 | Disposition: A | Discharge status: Home Health | Payer: PPO | Attending: Internal Medicine | Admitting: Internal Medicine

## 2020-04-28 DIAGNOSIS — K5289 Other specified noninfective gastroenteritis and colitis: Secondary | ICD-10-CM | POA: Diagnosis present

## 2020-04-28 DIAGNOSIS — R0902 Hypoxemia: Secondary | ICD-10-CM | POA: Diagnosis not present

## 2020-04-28 DIAGNOSIS — J9811 Atelectasis: Secondary | ICD-10-CM | POA: Diagnosis not present

## 2020-04-28 DIAGNOSIS — R0600 Dyspnea, unspecified: Secondary | ICD-10-CM

## 2020-04-28 DIAGNOSIS — Z7189 Other specified counseling: Secondary | ICD-10-CM | POA: Diagnosis not present

## 2020-04-28 DIAGNOSIS — M4319 Spondylolisthesis, multiple sites in spine: Secondary | ICD-10-CM | POA: Diagnosis not present

## 2020-04-28 DIAGNOSIS — I248 Other forms of acute ischemic heart disease: Secondary | ICD-10-CM | POA: Diagnosis not present

## 2020-04-28 DIAGNOSIS — N39490 Overflow incontinence: Secondary | ICD-10-CM | POA: Diagnosis not present

## 2020-04-28 DIAGNOSIS — I4891 Unspecified atrial fibrillation: Secondary | ICD-10-CM | POA: Diagnosis present

## 2020-04-28 DIAGNOSIS — I10 Essential (primary) hypertension: Secondary | ICD-10-CM | POA: Diagnosis not present

## 2020-04-28 DIAGNOSIS — Z20822 Contact with and (suspected) exposure to covid-19: Secondary | ICD-10-CM | POA: Diagnosis not present

## 2020-04-28 DIAGNOSIS — R778 Other specified abnormalities of plasma proteins: Secondary | ICD-10-CM | POA: Diagnosis not present

## 2020-04-28 DIAGNOSIS — K633 Ulcer of intestine: Secondary | ICD-10-CM | POA: Diagnosis not present

## 2020-04-28 DIAGNOSIS — I4892 Unspecified atrial flutter: Secondary | ICD-10-CM | POA: Diagnosis not present

## 2020-04-28 DIAGNOSIS — K626 Ulcer of anus and rectum: Secondary | ICD-10-CM

## 2020-04-28 DIAGNOSIS — K644 Residual hemorrhoidal skin tags: Secondary | ICD-10-CM | POA: Diagnosis not present

## 2020-04-28 DIAGNOSIS — R159 Full incontinence of feces: Secondary | ICD-10-CM

## 2020-04-28 DIAGNOSIS — K648 Other hemorrhoids: Secondary | ICD-10-CM | POA: Diagnosis present

## 2020-04-28 DIAGNOSIS — E860 Dehydration: Secondary | ICD-10-CM | POA: Diagnosis not present

## 2020-04-28 DIAGNOSIS — K5641 Fecal impaction: Secondary | ICD-10-CM | POA: Diagnosis present

## 2020-04-28 DIAGNOSIS — Z66 Do not resuscitate: Secondary | ICD-10-CM | POA: Diagnosis present

## 2020-04-28 DIAGNOSIS — Z515 Encounter for palliative care: Secondary | ICD-10-CM

## 2020-04-28 DIAGNOSIS — I480 Paroxysmal atrial fibrillation: Secondary | ICD-10-CM | POA: Diagnosis not present

## 2020-04-28 DIAGNOSIS — K5900 Constipation, unspecified: Secondary | ICD-10-CM | POA: Diagnosis not present

## 2020-04-28 DIAGNOSIS — I959 Hypotension, unspecified: Secondary | ICD-10-CM | POA: Diagnosis not present

## 2020-04-28 DIAGNOSIS — J9601 Acute respiratory failure with hypoxia: Secondary | ICD-10-CM | POA: Diagnosis not present

## 2020-04-28 DIAGNOSIS — R000 Tachycardia, unspecified: Secondary | ICD-10-CM | POA: Diagnosis not present

## 2020-04-28 DIAGNOSIS — J90 Pleural effusion, not elsewhere classified: Secondary | ICD-10-CM | POA: Diagnosis not present

## 2020-04-28 DIAGNOSIS — K625 Hemorrhage of anus and rectum: Secondary | ICD-10-CM | POA: Diagnosis not present

## 2020-04-28 DIAGNOSIS — D259 Leiomyoma of uterus, unspecified: Secondary | ICD-10-CM | POA: Diagnosis not present

## 2020-04-28 DIAGNOSIS — K5732 Diverticulitis of large intestine without perforation or abscess without bleeding: Secondary | ICD-10-CM | POA: Diagnosis not present

## 2020-04-28 DIAGNOSIS — I351 Nonrheumatic aortic (valve) insufficiency: Secondary | ICD-10-CM | POA: Diagnosis not present

## 2020-04-28 DIAGNOSIS — R0689 Other abnormalities of breathing: Secondary | ICD-10-CM | POA: Diagnosis not present

## 2020-04-28 DIAGNOSIS — K529 Noninfective gastroenteritis and colitis, unspecified: Secondary | ICD-10-CM | POA: Diagnosis not present

## 2020-04-28 HISTORY — DX: Unspecified hemorrhoids: K64.9

## 2020-04-28 LAB — CBC WITH DIFFERENTIAL/PLATELET
Abs Immature Granulocytes: 0.06 10*3/uL (ref 0.00–0.07)
Basophils Absolute: 0.1 10*3/uL (ref 0.0–0.1)
Basophils Relative: 0 %
Eosinophils Absolute: 0 10*3/uL (ref 0.0–0.5)
Eosinophils Relative: 0 %
HCT: 44.2 % (ref 36.0–46.0)
Hemoglobin: 14 g/dL (ref 12.0–15.0)
Immature Granulocytes: 1 %
Lymphocytes Relative: 5 %
Lymphs Abs: 0.6 10*3/uL — ABNORMAL LOW (ref 0.7–4.0)
MCH: 29.7 pg (ref 26.0–34.0)
MCHC: 31.7 g/dL (ref 30.0–36.0)
MCV: 93.8 fL (ref 80.0–100.0)
Monocytes Absolute: 1.2 10*3/uL — ABNORMAL HIGH (ref 0.1–1.0)
Monocytes Relative: 10 %
Neutro Abs: 10 10*3/uL — ABNORMAL HIGH (ref 1.7–7.7)
Neutrophils Relative %: 84 %
Platelets: 248 10*3/uL (ref 150–400)
RBC: 4.71 MIL/uL (ref 3.87–5.11)
RDW: 15.4 % (ref 11.5–15.5)
WBC: 11.9 10*3/uL — ABNORMAL HIGH (ref 4.0–10.5)
nRBC: 0 % (ref 0.0–0.2)

## 2020-04-28 LAB — COMPREHENSIVE METABOLIC PANEL
ALT: 21 U/L (ref 0–44)
AST: 28 U/L (ref 15–41)
Albumin: 3.1 g/dL — ABNORMAL LOW (ref 3.5–5.0)
Alkaline Phosphatase: 70 U/L (ref 38–126)
Anion gap: 9 (ref 5–15)
BUN: 24 mg/dL — ABNORMAL HIGH (ref 8–23)
CO2: 23 mmol/L (ref 22–32)
Calcium: 8.1 mg/dL — ABNORMAL LOW (ref 8.9–10.3)
Chloride: 103 mmol/L (ref 98–111)
Creatinine, Ser: 0.93 mg/dL (ref 0.44–1.00)
GFR, Estimated: 58 mL/min — ABNORMAL LOW (ref >60)
Glucose, Bld: 126 mg/dL — ABNORMAL HIGH (ref 70–99)
Potassium: 4.2 mmol/L (ref 3.5–5.1)
Sodium: 135 mmol/L (ref 135–145)
Total Bilirubin: 1 mg/dL (ref 0.3–1.2)
Total Protein: 6.4 g/dL — ABNORMAL LOW (ref 6.5–8.1)

## 2020-04-28 LAB — LIPASE, BLOOD: Lipase: 19 U/L (ref 11–51)

## 2020-04-28 LAB — RESPIRATORY PANEL BY RT PCR (FLU A&B, COVID)
Influenza A by PCR: NEGATIVE
Influenza B by PCR: NEGATIVE
SARS Coronavirus 2 by RT PCR: NEGATIVE

## 2020-04-28 LAB — MAGNESIUM: Magnesium: 2.2 mg/dL (ref 1.7–2.4)

## 2020-04-28 MED ORDER — ONDANSETRON HCL 4 MG/2ML IJ SOLN
4.0000 mg | Freq: Once | INTRAMUSCULAR | Status: AC
  Administered 2020-04-28: 4 mg via INTRAVENOUS

## 2020-04-28 MED ORDER — DILTIAZEM HCL-DEXTROSE 125-5 MG/125ML-% IV SOLN (PREMIX)
5.0000 mg/h | INTRAVENOUS | Status: DC
  Administered 2020-04-28: 15 mg/h via INTRAVENOUS
  Filled 2020-04-28: qty 125

## 2020-04-28 MED ORDER — DILTIAZEM LOAD VIA INFUSION
10.0000 mg | Freq: Once | INTRAVENOUS | Status: AC
  Administered 2020-04-28: 10 mg via INTRAVENOUS
  Filled 2020-04-28: qty 10

## 2020-04-28 MED ORDER — METRONIDAZOLE IN NACL 5-0.79 MG/ML-% IV SOLN: 500.0000 mg | Freq: Three times a day (TID) | INTRAVENOUS | Status: DC

## 2020-04-28 MED ORDER — LACTATED RINGERS IV SOLN: INTRAVENOUS | Status: DC

## 2020-04-28 MED ORDER — LACTATED RINGERS IV BOLUS
500.0000 mL | Freq: Once | INTRAVENOUS | Status: AC
Start: 2020-04-28 — End: 2020-04-28
  Administered 2020-04-28: 500 mL via INTRAVENOUS

## 2020-04-28 MED ORDER — SODIUM CHLORIDE 0.9 % IV SOLN
2.0000 g | INTRAVENOUS | Status: DC
  Administered 2020-04-28 – 2020-04-29 (×2): 2 g via INTRAVENOUS
  Filled 2020-04-28 (×2): qty 20

## 2020-04-28 MED ORDER — IOHEXOL 300 MG/ML  SOLN
100.0000 mL | Freq: Once | INTRAMUSCULAR | Status: AC | PRN
  Administered 2020-04-28: 100 mL via INTRAVENOUS

## 2020-04-28 MED ORDER — METRONIDAZOLE IN NACL 5-0.79 MG/ML-% IV SOLN
500.0000 mg | Freq: Three times a day (TID) | INTRAVENOUS | Status: DC
  Administered 2020-04-29: 500 mg via INTRAVENOUS
  Filled 2020-04-28: qty 100

## 2020-04-28 NOTE — H&P (Addendum)
History and Physical    Latoya Long RXV:[id number] DOB: 1927-10-31 DOA: 04/28/2020  PCP: Rory Percy, MD   Patient coming from: Home  I have personally briefly reviewed patient's old medical records in [place]  Chief Complaint: Diarrhea  HPI: Latoya Long is a 86 y.o. female with medical history significant for hypertension.  Patient has been having multiple continuous watery stools since yesterday.  Yesterday and today patient had small amounts of blood in stool.  No abdominal pain.  Patient was weak, today and unable to ambulate.  Patient has a history of chronic constipation.  Patient was seen at [facility] ER yesterday and diagnosed with UTI but not so not given any medications.  Daughter was concerned about stool C. difficile and brought patient to the ED here.  Patient and daughter deny history of stroke, no cardiac history.  No history of irregular heart rhythm.  Denies history of frequent falls, daughter reports 2 falls over the past few years which are mechanical falls.  Patient never smoked cigarettes, denies difficulty breathing or cough.  No chest pain.  No lower extremity swelling, no recent trips.  Denies Dysuria or lower abdominal pain.  ED Course: heart rate initially in the 90s increased to 159.  O2 sats initially greater than 92% on room air, after procedure dropped to mid 80s placed on 3 L nasal cannula.  EKG showing atrial fibrillation with RVR.  Magnesium 2.2.  WBC 11.9.  Large stool in the rectal vault concerning for stercoral colitis.  Fecal disimpaction attempted in the ED largely unsuccessful, with small amount of blood in stool.  Cardizem drip started.  500 mill bolus given.  Hospitalist admit for atrial fibrillation with RVR, and stercoral colitis.  Review of Systems: As per HPI all other systems reviewed and negative.  Past Medical History:  Diagnosis Date  . Hemorrhoids   . Hypertension     Past Surgical History:  Procedure Laterality Date  .  TONSILLECTOMY       reports that she has never smoked. She has never used smokeless tobacco. She reports that she does not drink alcohol and does not use drugs.  No Known Allergies  History of stroke- sister.  Prior to Admission medications   Medication Sig Start Date End Date Taking? Authorizing Provider  bisoprolol-hydrochlorothiazide (ZIAC) 10-6.25 MG per tablet Take 1 tablet by mouth daily. 10/20/13   [provider]    Physical Exam: Vitals:   04/28/20 2110 04/28/20 2116 04/28/20 2130 04/28/20 2145  BP: (!) 118/100 104/64 (!) 114/53 (!) 109/53  Pulse: (!) 138 (!) 125 89 86  Resp: (!) 25 (!) 27 (!) 22 (!) 29  Temp:      TempSrc:      SpO2: 90% 90% 92% 94%  Weight:      Height:        Constitutional: NAD, calm, comfortable Vitals:   04/28/20 2110 04/28/20 2116 04/28/20 2130 04/28/20 2145  BP: (!) 118/100 104/64 (!) 114/53 (!) 109/53  Pulse: (!) 138 (!) 125 89 86  Resp: (!) 25 (!) 27 (!) 22 (!) 29  Temp:      TempSrc:      SpO2: 90% 90% 92% 94%  Weight:      Height:       Eyes: PERRL, lids and conjunctivae normal ENMT: Mucous membranes are moist. Posterior pharynx clear of any exudate or lesions.Normal dentition.  Neck: normal, supple, no masses, no thyromegaly Respiratory: on my evaluation O2 sats down to  88% on room air, initial audible wheezing, poor chest clear to auscultation bilaterally, no wheezing, no crackles. Normal respiratory effort. No accessory muscle use.  Cardiovascular: Irregular rate and rhythm, no murmurs / rubs / gallops. No extremity edema. 2+ pedal pulses.   Abdomen: no tenderness, no masses palpated. No hepatosplenomegaly. Bowel sounds positive.  Bleeding per rectum, small amount of blood witnessed, ~ 47mls oozing out during my evaluation.  Small external hemorrhoids seen. Musculoskeletal: no clubbing / cyanosis. No joint deformity upper and lower extremities. Good ROM, no contractures. Normal muscle tone.  Skin: no rashes, lesions, ulcers.  No induration Neurologic: No apparent cranial formality, moving extremities spontaneously.Marland Kitchen  Psychiatric: Normal judgment and insight. Alert and oriented x 3. Normal mood.   Labs on Admission: I have personally reviewed following labs and imaging studies  CBC: Recent Labs  Lab 04/28/20 1749  WBC 11.9*  NEUTROABS 10.0*  HGB 14.0  HCT 44.2  MCV 93.8  PLT 097   Basic Metabolic Panel: Recent Labs  Lab 04/28/20 1749  NA 135  K 4.2  CL 103  CO2 23  GLUCOSE 126*  BUN 24*  CREATININE 0.93  CALCIUM 8.1*  MG 2.2   Liver Function Tests: Recent Labs  Lab 04/28/20 1749  AST 28  ALT 21  ALKPHOS 70  BILITOT 1.0  PROT 6.4*  ALBUMIN 3.1*   Recent Labs  Lab 04/28/20 1749  LIPASE 19    EKG: Independently reviewed.  Atrial fibrillation with RVR, rate 136, QTc 468.  Last EKG from 2015 showed sinus rhythm.  Assessment/Plan Principal Problem:   Atrial fibrillation with rapid ventricular response (HCC) Active Problems:   Stercoral colitis   HTN (hypertension)   Atrial fibrillation with RVR-rates up to 159.  No cardiac history.  Likely provoked by stercoral colitis and fecal disimpaction attempted in ED.  CHaD2Vasc score- 4-for age, sex, and history of hypertension.  No history of falls.  Currently mild bleeding per rectum. K- 4.2. Mag- 2.2 -Continue Cardizem drip -Hold off on anticoagulation. -Obtain echocardiogram - Trend troponin, TSH  Stercoral colitis-presenting with diarrhea likely postobstructive. Colitis likely from fecal impaction.  Fecal disimpaction attempted in the ED likely unsuccessful. -Will start IV ceftriaxone and metronidazole -BMP, CBC in the morning -Will order fleet enema in the morning - Continue RL 100cc/hr x 15 hrs -Will need bowel regimen prior to discharge - NPO  Rectal bleeding-mild at this time.  Likely due to hemorrhoids, aggravated by attempted disimpaction in the ED.  Hemoglobin stable 14. -CBC in the morning  Hypoxia-O2 sats initially  greater than 92% on room air, after attempted procedure O2 sats down to mid 80s on room air, currently on 3 L.  No dyspnea no cough no chest pain, no swelling.  No smoking history.  At this time doubt PE. ?  Splinting, atelectasis. -Obtain portable chest x-ray. -Reassess in a.m. -Incentive spirometry  Hypertension-blood pressure systolic 10 4-1 35.  Daughter reports patient is currently not on antihypertensive medications.   DVT prophylaxis: SCDs for now with rectal bleeding Code Status: Full code. Family Communication: Daughter at bedside Disposition Plan:  ~ 2 days, pending improvement in heart rhythm,/rate and management of fecal impaction Consults called: None Admission status: Inpatient, stepdown I certify that at the point of admission it is my clinical judgment that the patient will require inpatient hospital care spanning beyond 2 midnights from the point of admission due to high intensity of service, high risk for further deterioration and high frequency of surveillance required.  Bethena Roys MD [facility]  04/28/2020, 10:02 PM

## 2020-04-28 NOTE — ED Notes (Signed)
Pt tolerating PO Fluids at this time.

## 2020-04-28 NOTE — ED Notes (Signed)
Per Arlyce Dice MD, ED Staff are to hold Soap Suds Enema at this time due to active bleeding.

## 2020-04-28 NOTE — ED Notes (Signed)
Lab at bedside

## 2020-04-28 NOTE — ED Provider Notes (Signed)
Emergency Department Provider Note   I have reviewed the triage vital signs and the nursing notes.   HISTORY  Chief Complaint Diarrhea   HPI Latoya Long is a 86 y.o. female with PMH of HTN and hemorrhoids presents to the ED via EMS with 3 days of diarrhea. Patient denies any known blood in the diarrhea but notes that she occasionally has this with history of hemorrhoids. She is not feeling SOB or having fever. She describes some "soreness" in her LLQ that is constant. Denies any CP. No recent abx. Per EMS the patient was diagnosed recently with UTI but no history of abx.     Past Medical History:  Diagnosis Date  . Hemorrhoids   . Hypertension     Patient Active Problem List   Diagnosis Date Noted  . Goals of care, counseling/discussion   . Palliative care by specialist   . DNR (do not resuscitate) discussion   . Atrial fibrillation with rapid ventricular response ([place]) 04/28/2020  . 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 HTN (hypertension) 04/28/2020  . Rectal bleeding 04/28/2020    Past Surgical History:  Procedure Laterality Date  . TONSILLECTOMY      Allergies Patient has no known allergies.  Family History  Problem Relation Age of Onset  . Stroke Sister     Social History Social History   Tobacco Use  . Smoking status: Never Smoker  . Smokeless tobacco: Never Used  Substance Use Topics  . Alcohol use: No  . Drug use: No    Review of Systems  Constitutional: No fever/chills. Positive generalized weakness.  Eyes: No visual changes. ENT: No sore throat. Cardiovascular: Denies chest pain. Respiratory: Denies shortness of breath. Gastrointestinal: Positive LLQ abdominal pain.  No nausea, no vomiting. Positive diarrhea.  No constipation. Genitourinary: Negative for dysuria. Musculoskeletal: Negative for back pain. Skin: Negative for rash. Neurological: Negative for headaches, focal weakness or numbness.  10-point ROS otherwise  negative.  ____________________________________________   PHYSICAL EXAM:  VITAL SIGNS: ED Triage Vitals  Enc Vitals Group     BP 04/28/20 1547 133/67     Pulse Rate 04/28/20 1547 99     Resp 04/28/20 1547 16     Temp 04/28/20 1547 99.3 F (37.4 C)     Temp Source 04/28/20 1547 Oral     SpO2 04/28/20 1547 99 %     Weight 04/28/20 1544 160 lb (72.6 kg)     Height 04/28/20 1544 5\' 5"  (1.651 m)   Constitutional: Alert and oriented. Well appearing and in no acute distress. Eyes: Conjunctivae are normal. Head: Atraumatic. Nose: No congestion/rhinnorhea. Mouth/Throat: Mucous membranes are moist. Neck: No stridor.   Cardiovascular: Normal rate, regular rhythm. Good peripheral circulation. Grossly normal heart sounds.   Respiratory: Normal respiratory effort.  No retractions. Lungs CTAB. Gastrointestinal: Soft with mild tenderness in the LLQ. No rebound or guarding. No distention.  Musculoskeletal: No lower extremity tenderness nor edema. No gross deformities of extremities. Neurologic:  Normal speech and language. No gross focal neurologic deficits are appreciated.  Skin:  Skin is warm, dry and intact. No rash noted.   ____________________________________________   LABS (all labs ordered are listed, but only abnormal results are displayed)  Labs Reviewed  COMPREHENSIVE METABOLIC PANEL - Abnormal; Notable for the following components:      Result Value   Glucose, Bld 126 (*)    BUN 24 (*)    Calcium 8.1 (*)    Total Protein 6.4 (*)  Albumin 3.1 (*)    GFR, Estimated 58 (*)    All other components within normal limits  CBC WITH DIFFERENTIAL/PLATELET - Abnormal; Notable for the following components:   WBC 11.9 (*)    Neutro Abs 10.0 (*)    Lymphs Abs 0.6 (*)    Monocytes Absolute 1.2 (*)    All other components within normal limits  BASIC METABOLIC PANEL - Abnormal; Notable for the following components:   CO2 21 (*)    Calcium 8.1 (*)    All other components within  normal limits  BASIC METABOLIC PANEL - Abnormal; Notable for the following components:   Sodium 133 (*)    Glucose, Bld 104 (*)    Calcium 7.9 (*)    All other components within normal limits  GLUCOSE, CAPILLARY - Abnormal; Notable for the following components:   Glucose-Capillary 174 (*)    All other components within normal limits  BASIC METABOLIC PANEL - Abnormal; Notable for the following components:   Glucose, Bld 101 (*)    Calcium 8.0 (*)    All other components within normal limits  BASIC METABOLIC PANEL - Abnormal; Notable for the following components:   Sodium 134 (*)    Glucose, Bld 155 (*)    Calcium 7.7 (*)    All other components within normal limits  CBC - Abnormal; Notable for the following components:   Hemoglobin 11.6 (*)    All other components within normal limits  TROPONIN I (HIGH SENSITIVITY) - Abnormal; Notable for the following components:   Troponin I (High Sensitivity) 272 (*)    All other components within normal limits  TROPONIN I (HIGH SENSITIVITY) - Abnormal; Notable for the following components:   Troponin I (High Sensitivity) 267 (*)    All other components within normal limits  RESPIRATORY PANEL BY RT PCR (FLU A&B, COVID)  MRSA PCR SCREENING  RESPIRATORY PANEL BY RT PCR (FLU A&B, COVID)  LIPASE, BLOOD  MAGNESIUM  TSH  HEMOGLOBIN AND HEMATOCRIT, BLOOD  CBC  CBC WITH DIFFERENTIAL/PLATELET  MAGNESIUM  LIPID PANEL  CBC  HEMOGLOBIN AND HEMATOCRIT, BLOOD  HEMOGLOBIN AND HEMATOCRIT, BLOOD  HEMOGLOBIN AND HEMATOCRIT, BLOOD  HEMOGLOBIN AND HEMATOCRIT, BLOOD  HEMOGLOBIN AND HEMATOCRIT, BLOOD  MAGNESIUM  CBC  MAGNESIUM   ____________________________________________  EKG   EKG Interpretation  Date/Time:  Sunday April 28 2020 21:23:35 EST Ventricular Rate:  88 PR Interval:  128 QRS Duration: 120 QT Interval:  378 QTC Calculation: 458 R Axis:   -36 Text Interpretation: Sinus rhythm Ventricular premature complex LVH with IVCD, LAD and  secondary repol abnrm Inferior infarct, old When compared with ECG of EARLIER SAME DATE Sinus rhythm has replaced Atrial fibrillation with rapid ventricular response Premature ventricular complexes are now present Confirmed by Delora Fuel ([id number]) on 04/29/2020 12:56:25 AM       ____________________________________________  RADIOLOGY  CT abdomen/pelvis reviewed.  ____________________________________________   PROCEDURES  Procedure(s) performed:   Fecal disimpaction  Date/Time: 04/28/2020 8:50 PM Performed by: Margette Fast, MD Authorized by: Margette Fast, MD  Preparation: Patient was prepped and draped in the usual sterile fashion. Local anesthesia used: no  Anesthesia: Local anesthesia used: no  Sedation: Patient sedated: no  Patient tolerance: patient tolerated the procedure well with no immediate complications Comments: During my digital rectal exam and after obtaining verbal consent from the patient and daughter at bedside I was able to remove a significant amount of soft BM from the rectum. No significant discomfort. Small volume BRB  mixed in with stool. No melena. Chaperone at bedside for exam and procedure.   .Critical Care Performed by: Margette Fast, MD Authorized by: Margette Fast, MD   Critical care provider statement:    Critical care time (minutes):  35   Critical care time was exclusive of:  Separately billable procedures and treating other patients and teaching time   Critical care was necessary to treat or prevent imminent or life-threatening deterioration of the following conditions:  Circulatory failure   Critical care was time spent personally by me on the following activities:  Discussions with consultants, evaluation of patient's response to treatment, examination of patient, ordering and performing treatments and interventions, ordering and review of laboratory studies, ordering and review of radiographic studies, pulse oximetry, re-evaluation of  patient's condition, obtaining history from patient or surrogate, review of old charts, blood draw for specimens and development of treatment plan with patient or surrogate   I assumed direction of critical care for this patient from another provider in my specialty: no       ____________________________________________   INITIAL IMPRESSION / ASSESSMENT AND PLAN / ED COURSE  Pertinent labs & imaging results that were available during my care of the patient were reviewed by me and considered in my medical decision making (see chart for details).   Patient presents emergency department with 3 days of diarrhea and left lower quadrant abdominal pain.  Abdomen is mildly tender on exam but no peritoneal findings.  Patient is awake and alert.  She is providing a relatively detailed history and does not appear particularly altered.  Vital signs are within normal limits here.  Plan for C. difficile testing, labs, IV fluids, CT abdomen pelvis to evaluate for underlying colitis/diverticulitis.  We will also send Covid swab.    08:45 PM  Made aware by nursing that just prior to administering the enema the patient developed A. fib with RVR on monitor.  Rate in the 140s.  Normal mental status and no significant symptoms.  Blood pressure is within normal limits.  Plan to start diltiazem infusion for rate control as the patient is asymptomatic and not a good candidate for electrical cardioversion.  A. fib may be brought on by dehydration resulting from several days of overflow incontinence from significant rectal stool burden.  This was cleared with manual disimpaction by me.  May benefit from additional enemas and constipation management while inpatient and managing her A. Fib.  Of note, there was a very small amount of bright red blood mixed in the stool with disimpaction the patient tolerated well with no significant pain or fissures noted.  Discussed patient's case with [facility] to request admission. Patient and  family (if present) updated with plan. Care transferred to [facility] service.  I reviewed all nursing notes, vitals, pertinent old records, EKGs, labs, imaging (as available).   ____________________________________________  FINAL CLINICAL IMPRESSION(S) / ED DIAGNOSES  Final diagnoses:  Atrial fibrillation with RVR (HCC)  Encopresis with constipation and overflow incontinence     MEDICATIONS GIVEN DURING THIS VISIT:  Medications  acetaminophen (TYLENOL) tablet 650 mg (has no administration in time range)    Or  acetaminophen (TYLENOL) suppository 650 mg (has no administration in time range)  ondansetron (ZOFRAN) tablet 4 mg (has no administration in time range)    Or  ondansetron (ZOFRAN) injection 4 mg (has no administration in time range)  sodium phosphate (FLEET) 7-19 GM/118ML enema 1 enema (has no administration in time range)  Chlorhexidine Gluconate Cloth 2 %  PADS 6 each (6 each Topical Given 05/02/20 1008)  lactated ringers infusion ( Intravenous Transfusing/Transfer 04/29/20 1056)  docusate sodium (COLACE) capsule 100 mg (100 mg Oral Given 05/02/20 1006)  labetalol (NORMODYNE) injection 5 mg (has no administration in time range)  diltiazem (CARDIZEM) 125 mg in dextrose 5% 125 mL (1 mg/mL) infusion (0 mg/hr Intravenous Stopped 05/02/20 1004)  feeding supplement (BOOST / RESOURCE BREEZE) liquid 1 Container (1 Container Oral Given 05/02/20 1005)  diltiazem (CARDIZEM) tablet 60 mg (60 mg Oral Given 05/02/20 1006)  lactated ringers bolus 500 mL (0 mLs Intravenous Stopped 04/28/20 1719)  ondansetron (ZOFRAN) injection 4 mg (4 mg Intravenous Given 04/28/20 1620)  iohexol (OMNIPAQUE) 300 MG/ML solution 100 mL (100 mLs Intravenous Contrast Given 04/28/20 1841)  diltiazem (CARDIZEM) 1 mg/mL load via infusion 10 mg (10 mg Intravenous Bolus from Bag 04/28/20 2110)  diltiazem (CARDIZEM) injection 10 mg (10 mg Intravenous Given 04/29/20 1612)  polyethylene glycol-electrolytes (NuLYTELY)  solution 4,000 mL (4,000 mLs Oral Given 04/30/20 1353)    Note:  This document was prepared using Dragon voice recognition software and may include unintentional dictation errors.  Nanda Quinton, MD, [facility]    Elmore Hyslop, [facility], MD 05/02/20 1009

## 2020-04-28 NOTE — ED Notes (Signed)
Pt transported to CT ?

## 2020-04-28 NOTE — ED Notes (Signed)
Lab reports stool was formed so unable to perform cdiff

## 2020-04-28 NOTE — ED Triage Notes (Signed)
Pt from home and c/o diarrhea x 3 days.  Pt seen at [facility] and was dx with UTI and no medications prescribed for her per daughter.  Family concerned pt with C-diff.

## 2020-04-28 NOTE — ED Notes (Signed)
ED Provider at bedside. 

## 2020-04-28 NOTE — ED Notes (Signed)
Pt stool sample obtained. Pt with formed stool. Pt bottom red, raw and areas of bleeding, EDP made aware.

## 2020-04-29 ENCOUNTER — Encounter (HOSPITAL_COMMUNITY): Payer: Self-pay | Admitting: Internal Medicine

## 2020-04-29 ENCOUNTER — Inpatient Hospital Stay (HOSPITAL_COMMUNITY): Payer: PPO

## 2020-04-29 DIAGNOSIS — I351 Nonrheumatic aortic (valve) insufficiency: Secondary | ICD-10-CM | POA: Diagnosis not present

## 2020-04-29 DIAGNOSIS — Z7189 Other specified counseling: Secondary | ICD-10-CM

## 2020-04-29 DIAGNOSIS — Z515 Encounter for palliative care: Secondary | ICD-10-CM

## 2020-04-29 DIAGNOSIS — K625 Hemorrhage of anus and rectum: Secondary | ICD-10-CM | POA: Diagnosis not present

## 2020-04-29 DIAGNOSIS — I10 Essential (primary) hypertension: Secondary | ICD-10-CM | POA: Diagnosis not present

## 2020-04-29 DIAGNOSIS — I4891 Unspecified atrial fibrillation: Secondary | ICD-10-CM

## 2020-04-29 DIAGNOSIS — K5289 Other specified noninfective gastroenteritis and colitis: Secondary | ICD-10-CM | POA: Diagnosis not present

## 2020-04-29 LAB — TSH: TSH: 1.143 u[IU]/mL (ref 0.350–4.500)

## 2020-04-29 LAB — ECHOCARDIOGRAM COMPLETE
Area-P 1/2: 2.99 cm2
Height: 65 in
P 1/2 time: 574 msec
S' Lateral: 2.96 cm
Weight: 2560 oz

## 2020-04-29 LAB — CBC
HCT: 44 % (ref 36.0–46.0)
Hemoglobin: 13.7 g/dL (ref 12.0–15.0)
MCH: 29.7 pg (ref 26.0–34.0)
MCHC: 31.1 g/dL (ref 30.0–36.0)
MCV: 95.2 fL (ref 80.0–100.0)
Platelets: 212 10*3/uL (ref 150–400)
RBC: 4.62 MIL/uL (ref 3.87–5.11)
RDW: 15.5 % (ref 11.5–15.5)
WBC: 9.5 10*3/uL (ref 4.0–10.5)
nRBC: 0 % (ref 0.0–0.2)

## 2020-04-29 LAB — CBC WITH DIFFERENTIAL/PLATELET
Abs Immature Granulocytes: 0.03 10*3/uL (ref 0.00–0.07)
Basophils Absolute: 0 10*3/uL (ref 0.0–0.1)
Basophils Relative: 1 %
Eosinophils Absolute: 0.1 10*3/uL (ref 0.0–0.5)
Eosinophils Relative: 2 %
HCT: 39 % (ref 36.0–46.0)
Hemoglobin: 12.5 g/dL (ref 12.0–15.0)
Immature Granulocytes: 0 %
Lymphocytes Relative: 8 %
Lymphs Abs: 0.7 10*3/uL (ref 0.7–4.0)
MCH: 29.7 pg (ref 26.0–34.0)
MCHC: 32.1 g/dL (ref 30.0–36.0)
MCV: 92.6 fL (ref 80.0–100.0)
Monocytes Absolute: 0.9 10*3/uL (ref 0.1–1.0)
Monocytes Relative: 10 %
Neutro Abs: 7.1 10*3/uL (ref 1.7–7.7)
Neutrophils Relative %: 79 %
Platelets: 220 10*3/uL (ref 150–400)
RBC: 4.21 MIL/uL (ref 3.87–5.11)
RDW: 15.1 % (ref 11.5–15.5)
WBC: 8.8 10*3/uL (ref 4.0–10.5)
nRBC: 0 % (ref 0.0–0.2)

## 2020-04-29 LAB — MRSA PCR SCREENING: MRSA by PCR: NEGATIVE

## 2020-04-29 LAB — TROPONIN I (HIGH SENSITIVITY)
Troponin I (High Sensitivity): 272 ng/L (ref <18)
Troponin I (High Sensitivity): 267 ng/L (ref <18)

## 2020-04-29 LAB — BASIC METABOLIC PANEL
Anion gap: 11 (ref 5–15)
BUN: 20 mg/dL (ref 8–23)
CO2: 21 mmol/L — ABNORMAL LOW (ref 22–32)
Calcium: 8.1 mg/dL — ABNORMAL LOW (ref 8.9–10.3)
Chloride: 103 mmol/L (ref 98–111)
Creatinine, Ser: 0.76 mg/dL (ref 0.44–1.00)
GFR, Estimated: >60 mL/min (ref >60)
Glucose, Bld: 90 mg/dL (ref 70–99)
Potassium: 4.1 mmol/L (ref 3.5–5.1)
Sodium: 135 mmol/L (ref 135–145)

## 2020-04-29 LAB — HEMOGLOBIN AND HEMATOCRIT, BLOOD
HCT: 40.1 % (ref 36.0–46.0)
Hemoglobin: 12.5 g/dL (ref 12.0–15.0)

## 2020-04-29 MED ORDER — METOPROLOL TARTRATE 25 MG PO TABS
25.0000 mg | ORAL_TABLET | Freq: Two times a day (BID) | ORAL | Status: DC
  Filled 2020-04-29: qty 1

## 2020-04-29 MED ORDER — DILTIAZEM HCL-DEXTROSE 125-5 MG/125ML-% IV SOLN (PREMIX)
5.0000 mg/h | INTRAVENOUS | Status: DC
  Administered 2020-04-29: 2.5 mg/h via INTRAVENOUS
  Administered 2020-04-30: 5 mg/h via INTRAVENOUS
  Administered 2020-05-02: 10 mg/h via INTRAVENOUS
  Filled 2020-04-29 (×4): qty 125

## 2020-04-29 MED ORDER — DOCUSATE SODIUM 100 MG PO CAPS
100.0000 mg | ORAL_CAPSULE | Freq: Two times a day (BID) | ORAL | Status: DC
  Administered 2020-04-29 – 2020-05-04 (×11): 100 mg via ORAL
  Filled 2020-04-29 (×10): qty 1

## 2020-04-29 MED ORDER — CHLORHEXIDINE GLUCONATE CLOTH 2 % EX PADS
6.0000 | MEDICATED_PAD | Freq: Every day | CUTANEOUS | Status: DC
  Administered 2020-04-29 – 2020-05-04 (×6): 6 via TOPICAL

## 2020-04-29 MED ORDER — LABETALOL HCL 5 MG/ML IV SOLN: 5.0000 mg | INTRAVENOUS | Status: DC | PRN

## 2020-04-29 MED ORDER — METOPROLOL TARTRATE 25 MG PO TABS
12.5000 mg | ORAL_TABLET | Freq: Two times a day (BID) | ORAL | Status: DC
  Administered 2020-04-29: 12.5 mg via ORAL
  Filled 2020-04-29: qty 1

## 2020-04-29 MED ORDER — DILTIAZEM HCL 25 MG/5ML IV SOLN
10.0000 mg | Freq: Once | INTRAVENOUS | Status: AC
  Administered 2020-04-29: 10 mg via INTRAVENOUS
  Filled 2020-04-29: qty 5

## 2020-04-29 MED ORDER — ACETAMINOPHEN 325 MG PO TABS: 650.0000 mg | ORAL_TABLET | Freq: Four times a day (QID) | ORAL | Status: DC | PRN

## 2020-04-29 MED ORDER — METRONIDAZOLE 500 MG PO TABS
500.0000 mg | ORAL_TABLET | Freq: Three times a day (TID) | ORAL | Status: DC
  Administered 2020-04-29 – 2020-04-30 (×3): 500 mg via ORAL
  Filled 2020-04-29 (×4): qty 1

## 2020-04-29 MED ORDER — FLEET ENEMA 7-19 GM/118ML RE ENEM: 1.0000 | ENEMA | Freq: Once | RECTAL | Status: DC | PRN

## 2020-04-29 MED ORDER — ACETAMINOPHEN 650 MG RE SUPP: 650.0000 mg | Freq: Four times a day (QID) | RECTAL | Status: DC | PRN

## 2020-04-29 MED ORDER — ONDANSETRON HCL 4 MG PO TABS: 4.0000 mg | ORAL_TABLET | Freq: Four times a day (QID) | ORAL | Status: DC | PRN

## 2020-04-29 MED ORDER — POLYETHYLENE GLYCOL 3350 17 G PO PACK
17.0000 g | PACK | Freq: Every day | ORAL | Status: DC
  Administered 2020-04-29 – 2020-04-30 (×2): 17 g via ORAL
  Filled 2020-04-29 (×2): qty 1

## 2020-04-29 MED ORDER — ONDANSETRON HCL 4 MG/2ML IJ SOLN: 4.0000 mg | Freq: Four times a day (QID) | INTRAMUSCULAR | Status: DC | PRN

## 2020-04-29 MED ORDER — LACTATED RINGERS IV SOLN: INTRAVENOUS | Status: DC

## 2020-04-29 NOTE — Progress Notes (Signed)
Pt converted back to NSR in the 80s-90s.

## 2020-04-29 NOTE — Progress Notes (Addendum)
PROGRESS NOTE   Latoya Long  KXF:[id number] DOB: [birth year] DOA: 04/28/2020 PCP: Rory Percy, MD   Chief Complaint  Patient presents with   Diarrhea   Brief Admission History:  86 y.o. female with medical history significant for hypertension.  Patient has been having multiple continuous watery stools since yesterday.  Yesterday and today patient had small amounts of blood in stool.  No abdominal pain.  Patient was weak, today and unable to ambulate.  Patient has a history of chronic constipation.  Patient was seen at [facility] ER yesterday and diagnosed with UTI but not so not given any medications.  Daughter was concerned about stool C. difficile and brought patient to the ED here.  Patient and daughter deny history of stroke, no cardiac history.  No history of irregular heart rhythm.  Denies history of frequent falls, daughter reports 2 falls over the past few years which are mechanical falls.  Patient never smoked cigarettes, denies difficulty breathing or cough.  No chest pain.  No lower extremity swelling, no recent trips.  Denies Dysuria or lower abdominal pain. ED Course: heart rate initially in the 90s increased to 159.  O2 sats initially greater than 92% on room air, after procedure dropped to mid 80s placed on 3 L nasal cannula.  EKG showing atrial fibrillation with RVR.  Magnesium 2.2.  WBC 11.9.  Large stool in the rectal vault concerning for stercoral colitis.  Fecal disimpaction attempted in the ED largely unsuccessful, with small amount of blood in stool.  Cardizem drip started.  500 mill bolus given.  Hospitalist admit for atrial fibrillation with RVR, and stercoral colitis and rectal bleeding.  Assessment & Plan:   Principal Problem:   Atrial fibrillation with rapid ventricular response (HCC) Active Problems:   Stercoral colitis   HTN (hypertension)   Rectal bleeding  AFib with RVR - likely reactive to acute stressors of disimpaction in ED, she has responded well to the IV  cardizem and it has been stopped now.  Echo pending.  TSH WNL. Metoprolol 12.5 mg BID started with holding parameters.  Holding all anticoagulation due to rectal bleeding.  Essential hypertension - trial of metoprolol as above.  Rectal bleeding - appreciate GI consult and recommendations.  Stercoral ulcer - she is on antibiotics.  Chronic constipation with fecal impaction - disimpacted in ED, appreciate GI recs for an appropriate bowel regimen.  Advanced care planning - appreciate palliative medicine consultation. Acute respiratory failure with hypoxia     DVT prophylaxis:  SCDs Code Status: full  Family Communication: daughter at bedside  Disposition: home   Status is: Inpatient  Remains inpatient appropriate because:IV treatments appropriate due to intensity of illness or inability to take PO and Inpatient level of care appropriate due to severity of illness  Dispo: The patient is from: Home              Anticipated d/c is to: Home              Anticipated d/c date is: 1 day              Patient currently is not medically stable to d/c.  Consultants:  GI Palliative   Procedures:    Antimicrobials:  ceftriaxone  metronidazole  Subjective: Pt reports that she would like to drink some more water.  Had some more blood in stool this morning.    Objective: Vitals:   04/29/20 0845 04/29/20 0900 04/29/20 0915 04/29/20 0930  BP: (!) 145/66 (!) 125/42 Marland Kitchen)  106/41 (!) 119/32  Pulse: 80 78 80 86  Resp: (!) 23 (!) 21 (!) 22 (!) 22  Temp:      TempSrc:      SpO2: 96% 93% 95% 95%  Weight:      Height:        Intake/Output Summary (Last 24 hours) at 04/29/2020 1033 Last data filed at 04/29/2020 0830 Gross per 24 hour  Intake 1644.3 ml  Output 1250 ml  Net 394.3 ml   Filed Weights   04/28/20 1544  Weight: 72.6 kg    Examination:  General exam: Appears calm and comfortable, appears younger than stated age.  Respiratory system: Clear to auscultation. Respiratory effort  normal. Cardiovascular system: S1 & S2 heard, RRR. No JVD, murmurs, rubs, gallops or clicks. No pedal edema. Gastrointestinal system: Abdomen is mildly distended, soft and nontender. No organomegaly or masses felt. Normal bowel sounds heard. Central nervous system: Alert and oriented. No focal neurological deficits. Extremities: Symmetric 5 x 5 power. Skin: No rashes, lesions or ulcers Psychiatry: Judgement and insight appear normal. Mood & affect appropriate.   Data Reviewed: I have personally reviewed following labs and imaging studies  CBC: Recent Labs  Lab 04/28/20 1749 04/29/20 0316 04/29/20 0810  WBC 11.9*  --  9.5  NEUTROABS 10.0*  --   --   HGB 14.0 12.5 13.7  HCT 44.2 40.1 44.0  MCV 93.8  --  95.2  PLT 248  --  510    Basic Metabolic Panel: Recent Labs  Lab 04/28/20 1749 04/29/20 0810  NA 135 135  K 4.2 4.1  CL 103 103  CO2 23 21*  GLUCOSE 126* 90  BUN 24* 20  CREATININE 0.93 0.76  CALCIUM 8.1* 8.1*  MG 2.2  --     GFR: Estimated Creatinine Clearance: 46.6 mL/min (by C-G formula based on SCr of 0.76 mg/dL).  Liver Function Tests: Recent Labs  Lab 04/28/20 1749  AST 28  ALT 21  ALKPHOS 70  BILITOT 1.0  PROT 6.4*  ALBUMIN 3.1*    CBG: No results for input(s): GLUCAP in the last 168 hours.  Recent Results (from the past 240 hour(s))  Respiratory Panel by RT PCR (Flu A&B, Covid) -     Status: None   Collection Time: 04/28/20  4:23 PM  Result Value Ref Range Status   SARS Coronavirus 2 by RT PCR NEGATIVE NEGATIVE Final    Comment: (NOTE) SARS-CoV-2 target nucleic acids are NOT DETECTED.  The SARS-CoV-2 RNA is generally detectable in upper respiratoy specimens during the acute phase of infection. The lowest concentration of SARS-CoV-2 viral copies this assay can detect is 131 copies/mL. A negative result does not preclude SARS-Cov-2 infection and should not be used as the sole basis for treatment or other patient management decisions. A  negative result may occur with  improper specimen collection/handling, submission of specimen other than nasopharyngeal swab, presence of viral mutation(s) within the areas targeted by this assay, and inadequate number of viral copies (<131 copies/mL). A negative result must be combined with clinical observations, patient history, and epidemiological information. The expected result is Negative.  Fact Sheet for Patients:  PinkCheek.be  Fact Sheet for Healthcare Providers:  GravelBags.it  This test is no t yet approved or cleared by the [place] FDA and  has been authorized for detection and/or diagnosis of SARS-CoV-2 by FDA under an Emergency Use Authorization (EUA). This EUA will remain  in effect (meaning this test can be used) for the  duration of the COVID-19 declaration under Section 564(b)(1) of the Act, 21 U.S.C. section 360bbb-3(b)(1), unless the authorization is terminated or revoked sooner.     Influenza A by PCR NEGATIVE NEGATIVE Final   Influenza B by PCR NEGATIVE NEGATIVE Final    Comment: (NOTE) The Xpert Xpress SARS-CoV-2/FLU/RSV assay is intended as an aid in  the diagnosis of influenza from Nasopharyngeal swab specimens and  should not be used as a sole basis for treatment. Nasal washings and  aspirates are unacceptable for Xpert Xpress SARS-CoV-2/FLU/RSV  testing.  Fact Sheet for Patients: PinkCheek.be  Fact Sheet for Healthcare Providers: GravelBags.it  This test is not yet approved or cleared by the [place] FDA and  has been authorized for detection and/or diagnosis of SARS-CoV-2 by  FDA under an Emergency Use Authorization (EUA). This EUA will remain  in effect (meaning this test can be used) for the duration of the  Covid-19 declaration under Section 564(b)(1) of the Act, 21  U.S.C. section 360bbb-3(b)(1), unless the authorization  is  terminated or revoked. Performed at [facility], [address] [place], [place] [address]      Radiology Studies: DG Chest 1 View  Result Date: 04/28/2020 CLINICAL DATA:  AFib EXAM: CHEST  1 VIEW COMPARISON:  April 27, 2020 FINDINGS: The heart size is stable. There is a trace right-sided pleural effusion. There is a persistent airspace opacity at the left lung base. The heart size is borderline enlarged. Aortic calcifications are noted. There is no pneumothorax. There is mild vascular congestion without overt pulmonary edema. IMPRESSION: Trace right-sided pleural effusion and bibasilar atelectasis. Electronically Signed   By: Constance Holster M.D.   On: 04/28/2020 23:30   CT ABDOMEN PELVIS W CONTRAST  Result Date: 04/28/2020 CLINICAL DATA:  86 year old female with concern for acute diverticulitis. EXAM: CT ABDOMEN AND PELVIS WITH CONTRAST TECHNIQUE: Multidetector CT imaging of the abdomen and pelvis was performed using the standard protocol following bolus administration of intravenous contrast. CONTRAST:  121mL OMNIPAQUE IOHEXOL 300 MG/ML  SOLN COMPARISON:  CT abdomen pelvis dated 11/06/2013. FINDINGS: Lower chest: There are bibasilar dependent atelectasis. There is a cluster of density in the left lower lobe which may represent atelectasis or infiltrate. Clinical correlation and follow-up to resolution recommended. No intra-abdominal free air or free fluid. Hepatobiliary: No focal liver abnormality is seen. No gallstones, gallbladder wall thickening, or biliary dilatation. Pancreas: Unremarkable. No pancreatic ductal dilatation or surrounding inflammatory changes. Spleen: Normal in size without focal abnormality. Adrenals/Urinary Tract: The adrenal glands unremarkable. There is no hydronephrosis on either side. There is symmetric enhancement and excretion of contrast by both kidneys. The visualized ureters and urinary bladder appear unremarkable. Stomach/Bowel: There is large stool in the  rectal vault. There is inflammatory changes of the perirectal fat concerning for stercoral colitis. Clinical correlation is recommended. There is no bowel obstruction. The appendix is normal. Vascular/Lymphatic: Advanced aortoiliac atherosclerotic disease. The IVC is unremarkable. No portal venous gas. There is no adenopathy. Reproductive: There is a 1 cm anterior fundal calcified fibroid. The ovaries are unremarkable. Other: Stranding and inflammatory changes of the presacral fat likely related to inflammatory/infectious process involving the rectal vault. No drainable fluid collection. Musculoskeletal: Osteopenia with extensive degenerative changes of the spine. Bilateral L5 pars defects with grade 1 L5-S1 anterolisthesis. Old-appearing compression fracture of the superior endplate of L1 with approximately 50% loss of vertebral body height. No acute osseous pathology. IMPRESSION: Large stool in the rectal vault with findings concerning for stercoral colitis. Clinical correlation  is recommended. No bowel obstruction. Normal appendix. Electronically Signed   By: Anner Crete M.D.   On: 04/28/2020 19:05   Scheduled Meds:  Chlorhexidine Gluconate Cloth  6 each Topical Daily   metroNIDAZOLE  500 mg Oral Q8H   Continuous Infusions:  cefTRIAXone (ROCEPHIN)  IV Stopped (04/28/20 2357)   diltiazem (CARDIZEM) infusion Stopped (04/29/20 0830)   lactated ringers 100 mL/hr at 04/28/20 2255     LOS: 1 day   Critical Care Procedure Note Authorized and Performed by: Murvin Natal MD  Total Critical Care time:  35 mins Due to a high probability of clinically significant, life threatening deterioration, the patient required my highest level of preparedness to intervene emergently and I personally spent this critical care time directly and personally managing the patient.  This critical care time included obtaining a history; examining the patient, pulse oximetry; ordering and review of studies; arranging urgent  treatment with development of a management plan; evaluation of patient's response of treatment; frequent reassessment; and discussions with other providers.  This critical care time was performed to assess and manage the high probability of imminent and life threatening deterioration that could result in multi-organ failure.  It was exclusive of separately billable procedures and treating other patients and teaching time.     Irwin Brakeman, MD How to contact the [facility] Attending or Consulting provider [place] or covering provider during after hours [place], for this patient?  Check the care team in [facility] and look for a) attending/consulting [facility] provider listed and b) the [facility] team listed Log into [email or web address] and use [place]'s universal password to access. If you do not have the password, please contact the hospital operator. Locate the [facility] provider you are looking for under [facility] and page to a number that you can be directly reached. If you still have difficulty reaching the provider, please page the [facility] (Director on Call) for the Hospitalists listed on amion for assistance.  04/29/2020, 10:33 AM

## 2020-04-29 NOTE — Consult Note (Addendum)
Referring Provider: Dr. Wynetta Emery  Primary Care Physician:  Rory Percy, MD Primary Gastroenterologist:  Dr. Abbey Chatters  Date of Admission: 04/28/20 Date of Consultation: 04/29/20  Reason for Consultation:  Rectal bleeding   HPI:  Latoya Long is a 86 y.o. year old female presenting to the ED yesterday due to watery stools, rectal bleeding, CT abd/pelvis with contrast yesterday with large stool in rectal vault and findings concerning for stercoral colitis. Fecal disimpaction in the ED with notes reporting significant amount of soft stool removed from rectum. Small volume blood mixed in stool. Patient was seen in [place] on 11/13 with Cdiff assay positive for toxin.  Flagyl recommended by ED at [facility], but it does not appear that patient started this prior to presenting the following day to [facility] ED. Repeat Cdiff ordered at [facility] but stool was formed so lab unable to process for Cdiff testing.   Daughter at bedside this morning. Patient notes history of chronic constipation dating back many years. Will take milk of magnesia as needed but no true bowel regimen. Does not like taking prescriptions or medications unless necessary. States the prior week before admission started having watery stools and then noted mixed with blood and presented to the ED yesterday. Felt pressure in rectum and fullness. She can't remember when she had a BM prior to watery stool onset and had been taking milk of magnesia without improvement. Noted some lower abdominal cramping several days ago but none now. Bright red/maroon blood oozing from rectum this morning. Hasn't had a BM since disimpaction in ED yesterday.   Has had a good appetite leading up to this episode.  Last colonoscopy in remote past in [place] per daughter. She believes it was over 10 years ago but was told normal. No melena reported. Hgb 14 yesterday and 13.7 this morning.   Patient lives alone. Daughter spends night with patient.   Past Medical  History:  Diagnosis Date  . Hemorrhoids   . Hypertension     Past Surgical History:  Procedure Laterality Date  . TONSILLECTOMY      Prior to Admission medications   Medication Sig Start Date End Date Taking? Authorizing Provider  bisoprolol-hydrochlorothiazide (ZIAC) 10-6.25 MG per tablet Take 1 tablet by mouth daily. 10/20/13   [provider]    Current Facility-Administered Medications  Medication Dose Route Frequency Provider Last Rate Last Admin  . acetaminophen (TYLENOL) tablet 650 mg  650 mg Oral Q6H PRN Emokpae, Ejiroghene E, MD       Or  . acetaminophen (TYLENOL) suppository 650 mg  650 mg Rectal Q6H PRN Emokpae, Ejiroghene E, MD      . cefTRIAXone (ROCEPHIN) 2 g in sodium chloride 0.9 % 100 mL IVPB  2 g Intravenous Q24H Emokpae, Ejiroghene E, MD   Stopped at 04/28/20 2357  . Chlorhexidine Gluconate Cloth 2 % PADS 6 each  6 each Topical Daily Murlean Iba, MD   6 each at 04/29/20 [phone]  . lactated ringers infusion   Intravenous Continuous Johnson, Clanford L, MD      . metoprolol tartrate (LOPRESSOR) tablet 12.5 mg  12.5 mg Oral BID Johnson, Clanford L, MD      . metroNIDAZOLE (FLAGYL) tablet 500 mg  500 mg Oral Q8H Johnson, Clanford L, MD   500 mg at 04/29/20 0160  . ondansetron (ZOFRAN) tablet 4 mg  4 mg Oral Q6H PRN Emokpae, Ejiroghene E, MD       Or  . ondansetron (ZOFRAN) injection 4  mg  4 mg Intravenous Q6H PRN Emokpae, Ejiroghene E, MD      . sodium phosphate (FLEET) 7-19 GM/118ML enema 1 enema  1 enema Rectal Once PRN Emokpae, Ejiroghene E, MD        Allergies as of 04/28/2020  . (No Known Allergies)    Family history: daughter with some type of colon resection. Family is not aware if any history of colon cancer or polyps.   Social History   Socioeconomic History  . Marital status: Widowed    Spouse name: Not on file  . Number of children: Not on file  . Years of education: Not on file  . Highest education level: Not on file  Occupational  History  . Not on file  Tobacco Use  . Smoking status: Never Smoker  . Smokeless tobacco: Never Used  Substance and Sexual Activity  . Alcohol use: No  . Drug use: No  . Sexual activity: Never  Other Topics Concern  . Not on file  Social History Narrative  . Not on file   Social Determinants of Health   Financial Resource Strain:   . Difficulty of Paying Living Expenses: Not on file  Food Insecurity:   . Worried About Charity fundraiser in the Last Year: Not on file  . Ran Out of Food in the Last Year: Not on file  Transportation Needs:   . Lack of Transportation (Medical): Not on file  . Lack of Transportation (Non-Medical): Not on file  Physical Activity:   . Days of Exercise per Week: Not on file  . Minutes of Exercise per Session: Not on file  Stress:   . Feeling of Stress : Not on file  Social Connections:   . Frequency of Communication with Friends and Family: Not on file  . Frequency of Social Gatherings with Friends and Family: Not on file  . Attends Religious Services: Not on file  . Active Member of Clubs or Organizations: Not on file  . Attends Archivist Meetings: Not on file  . Marital Status: Not on file  Intimate Partner Violence:   . Fear of Current or Ex-Partner: Not on file  . Emotionally Abused: Not on file  . Physically Abused: Not on file  . Sexually Abused: Not on file    Review of Systems: Gen: see HPI CV: Denies chest pain, heart palpitations, syncope, edema  Resp: Denies shortness of breath with rest, cough, wheezing GI: see HPI GU : Denies urinary burning, urinary frequency, urinary incontinence.  MS: Denies joint pain,swelling, cramping Derm: Denies rash, itching, dry skin Psych: Denies depression, anxiety,confusion, or memory loss Heme: see HPI  Physical Exam: Vital signs in last 24 hours: Temp:  [98.2 F (36.8 C)-99.3 F (37.4 C)] 98.6 F (37 C) (11/15 0805) Pulse Rate:  [22-159] 86 (11/15 0930) Resp:  [16-54] 22  (11/15 0930) BP: (98-162)/(32-100) 119/32 (11/15 0930) SpO2:  [83 %-100 %] 95 % (11/15 0930) Weight:  [72.6 kg] 72.6 kg (11/14 1544) Last BM Date: 04/29/20 General:   Alert,  Well-developed, well-nourished, pleasant and cooperative in NAD Head:  Normocephalic and atraumatic. Eyes:  Sclera clear, no icterus.   Conjunctiva pink. Ears:  Normal auditory acuity. Nose:  No deformity, discharge,  or lesions. Mouth:  No deformity or lesions Lungs:  Clear throughout to auscultation.    Heart:  S1 S2 present, no obvious murmurs Abdomen:  Soft, nontender and nondistended. No masses, hepatosplenomegaly or hernias noted. Normal bowel sounds, without guarding,  and without rebound.   Rectal:  Erythematous and raw skin around rectum, no thrombosed external hemorrhoids. Difficult to see due to slow oozing of blood from rectum. Internal exam with soft stool in rectal vault and removed moderate amount with some discomfort. Steady slow oozing for several minutes from rectum of red/burgundy stool and blood after manipulation. Difficult to appreciate if any prolapsing internal hemorrhoids.  Msk:  Symmetrical without gross deformities. Normal posture. Extremities:  Without edema. Neurologic:  Alert and  oriented x4 Psych:  Alert and cooperative. Normal mood and affect.  Intake/Output from previous day: 11/14 0701 - 11/15 0700 In: 700 [IV Piggyback:700] Out: 600 [Urine:600] Intake/Output this shift: Total I/O In: 944.3 [I.V.:944.3] Out: 650 [Urine:650]  Lab Results: Recent Labs    04/28/20 1749 04/29/20 0316 04/29/20 0810  WBC 11.9*  --  9.5  HGB 14.0 12.5 13.7  HCT 44.2 40.1 44.0  PLT 248  --  212   BMET Recent Labs    04/28/20 1749 04/29/20 0810  NA 135 135  K 4.2 4.1  CL 103 103  CO2 23 21*  GLUCOSE 126* 90  BUN 24* 20  CREATININE 0.93 0.76  CALCIUM 8.1* 8.1*   LFT Recent Labs    04/28/20 1749  PROT 6.4*  ALBUMIN 3.1*  AST 28  ALT 21  ALKPHOS 70  BILITOT 1.0    Studies/Results: DG Chest 1 View  Result Date: 04/28/2020 CLINICAL DATA:  AFib EXAM: CHEST  1 VIEW COMPARISON:  April 27, 2020 FINDINGS: The heart size is stable. There is a trace right-sided pleural effusion. There is a persistent airspace opacity at the left lung base. The heart size is borderline enlarged. Aortic calcifications are noted. There is no pneumothorax. There is mild vascular congestion without overt pulmonary edema. IMPRESSION: Trace right-sided pleural effusion and bibasilar atelectasis. Electronically Signed   By: Constance Holster M.D.   On: 04/28/2020 23:30   CT ABDOMEN PELVIS W CONTRAST  Result Date: 04/28/2020 CLINICAL DATA:  86 year old female with concern for acute diverticulitis. EXAM: CT ABDOMEN AND PELVIS WITH CONTRAST TECHNIQUE: Multidetector CT imaging of the abdomen and pelvis was performed using the standard protocol following bolus administration of intravenous contrast. CONTRAST:  120mL OMNIPAQUE IOHEXOL 300 MG/ML  SOLN COMPARISON:  CT abdomen pelvis dated 11/06/2013. FINDINGS: Lower chest: There are bibasilar dependent atelectasis. There is a cluster of density in the left lower lobe which may represent atelectasis or infiltrate. Clinical correlation and follow-up to resolution recommended. No intra-abdominal free air or free fluid. Hepatobiliary: No focal liver abnormality is seen. No gallstones, gallbladder wall thickening, or biliary dilatation. Pancreas: Unremarkable. No pancreatic ductal dilatation or surrounding inflammatory changes. Spleen: Normal in size without focal abnormality. Adrenals/Urinary Tract: The adrenal glands unremarkable. There is no hydronephrosis on either side. There is symmetric enhancement and excretion of contrast by both kidneys. The visualized ureters and urinary bladder appear unremarkable. Stomach/Bowel: There is large stool in the rectal vault. There is inflammatory changes of the perirectal fat concerning for stercoral colitis.  Clinical correlation is recommended. There is no bowel obstruction. The appendix is normal. Vascular/Lymphatic: Advanced aortoiliac atherosclerotic disease. The IVC is unremarkable. No portal venous gas. There is no adenopathy. Reproductive: There is a 1 cm anterior fundal calcified fibroid. The ovaries are unremarkable. Other: Stranding and inflammatory changes of the presacral fat likely related to inflammatory/infectious process involving the rectal vault. No drainable fluid collection. Musculoskeletal: Osteopenia with extensive degenerative changes of the spine. Bilateral L5 pars defects with grade 1  L5-S1 anterolisthesis. Old-appearing compression fracture of the superior endplate of L1 with approximately 50% loss of vertebral body height. No acute osseous pathology. IMPRESSION: Large stool in the rectal vault with findings concerning for stercoral colitis. Clinical correlation is recommended. No bowel obstruction. Normal appendix. Electronically Signed   By: Anner Crete M.D.   On: 04/28/2020 19:05    Impression: Pleasant 86 year old female with reports of watery diarrhea and rectal bleeding, fatigue, weakness, presenting to the ED yesterday with findings on CT of large stool in rectal vault and concerning for stercoral colitis. Diarrhea appeared to be more of an overflow incontinence in this setting; however, after review of Care Everywhere, she did present to [facility] 11/13 and found to have positive Cdiff toxin. She was able to undergo manual disimpaction in ED yesterday with significant amount of stool removed and evidence for low-volume bleeding at that time from rectum. Attempt for Cdiff testing while here was not successful in light of formed stool.   At bedside, DRE was performed. On exam, she had red/burgundy oozing of soft stool and blood, worsened by DRE and steady oozing for several minutes. I was able to remove moderate amount of soft stool from rectum, with patient noting mild  discomfort but no pain. Enema had been ordered previously but is on hold due to bleeding.   Hgb remaining stable and no evidence for acute blood loss anemia. Abdominal exam benign. Rectal bleeding multifactorial in setting of stercoral colitis and likely ulceration +/- internal hemorrhoids. Oozing is notable with any rectal manipulation. Last colonoscopy over 10 years ago.   Hold off on colonoscopy unless bleeding is significant, acute blood loss anemia, etc. For now, will start on bowel regimen and provide supportive measures. Empiric antibiotics started by admitting provider but may be discontinued in this setting.  I have placed a call to ID to review the Cdiff toxin positive result from [facility], as I'm unclear on the exact test that was performed. Could be potentially a carrier and would follow conservatively in that case.   Plan: Stool softener BID Add Miralax Follow H/H Monitor for worsening rectal bleeding I have placed a call to ID to review case Further recommendations to follow.  Annitta Needs, PhD, ANP-BC [facility]      LOS: 1 day    04/29/2020, 10:35 AM   ADDENDUM: spoke with Dr. Linus Salmons regarding outside [facility] testing. Likely a PCR test. Likely colonized. No antibiotics indicated as suspected in light of her presentation. Annitta Needs, PhD, ANP-BC [facility]

## 2020-04-29 NOTE — Progress Notes (Signed)
*  PRELIMINARY RESULTS* Echocardiogram 2D Echocardiogram has been performed.  Leavy Cella 04/29/2020, 11:32 AM

## 2020-04-29 NOTE — Progress Notes (Signed)
Nurse notified MD pt had 15 beat of Vtach; no new orders obtained.

## 2020-04-29 NOTE — ED Notes (Signed)
Date and time results received: 04/29/20 0033   Test: TROPONIN Critical Value: 272  Name of Provider Notified: Zierle-Ghosh, DO

## 2020-04-29 NOTE — Progress Notes (Signed)
Nurse notified MD pt converted back to afib with HR between 115-140s; orders obtained to give 1 time dose of 10mg  Cardizem.

## 2020-04-29 NOTE — Consult Note (Signed)
Consultation Note Date: 04/29/2020   Patient Name: Latoya Long  DOB: 02-13-1928  MRN: [id number]  Age / Sex: 86 y.o., female  PCP: Rory Percy, MD Referring Physician: Murlean Iba, MD  Reason for Consultation: Establishing goals of care and Psychosocial/spiritual support  HPI/Patient Profile: 86 y.o. female  with past medical history of very limited health history of tonsillectomy in her youth and hypertension, takes no medications except rarely over-the-counter's, independent with ADLs admitted on 04/28/2020 with A. fib with RVR likely reactive to acute stressor of disimpaction in the ED, HTN, rectal bleeding.   Clinical Assessment and Goals of Care:  I have reviewed medical records including EPIC notes, labs and imaging, received report from bedside nursing staff, examined the patient and met at bedside with daughter, Latoya Laming "Latoya Long" to discuss diagnosis prognosis, Latoya Long, EOL wishes, disposition and options.  Latoya Long is resting quietly in bed.  She greets me making and mostly keeping eye contact.  She appears younger than stated age.  She is alert and oriented, able to make her needs known.  I introduced Palliative Medicine as specialized medical care for people living with serious illness. It focuses on providing relief from the symptoms and stress of a serious illness.   We discussed a brief life review of the patient.  Latoya Long husband died in 2011/09/13.  She has been living independently since that time.  Recently, daughter Latoya Long spends the night, assisting with light household chores.  As far as functional and nutritional status, Latoya Long is able to manage her ADLs.  Latoya Long is present to assist with IADLs as needed.  We talked about the treatment plan in detail including, but not limited to GI consult, rectal bleeding, irregular heart rate, time for outcomes.  We discussed her current illness and  what it means in the larger context of her on-going co-morbidities.  Natural disease trajectory and expectations at EOL were discussed.  Advanced directives, concepts specific to code status were considered and discussed.  Questions and concerns were addressed.  The family was encouraged to call with questions or concerns.   Conference with attending, GI nurse practitioner, bedside nursing staff, transition of care team related to patient condition, needs, goals of care. PMT to continue to follow.   HCPOA   NEXT OF KIN -daughter at bedside, Latoya Long, tells me that she and her mother are very close, she is her mother's main caregiver.  She states that she is the surrogate decision-maker, but she keeps her siblings informed.  Latoya Long spouse died in 2011-09-13.  She lives independently.  She has 3 children, daughter Latoya Long", lives 7 minutes away and spends the night with Latoya Long.  She has another daughter who lives in [place], and a son who lives in [place].    SUMMARY OF RECOMMENDATIONS   At this point continue to treat the treatable but no CPR or intubation Agreeable to sigmoidoscopy if needed for evaluation of bleeding Anticipate returning home with home health over short-term rehab   Code Status/Advance  Care Planning:  DNR -CODE STATUS discussed with patient with daughter Latoya Long present.  Latoya Long endorses treating the treatable but no life support.  Latoya Long states that she can agree with/support this choice.  Symptom Management:   Per hospitalist, no additional needs at this time.  Palliative Prophylaxis:   Bowel Regimen, Frequent Pain Assessment and Palliative Wound Care  Additional Recommendations (Limitations, Scope, Preferences):  Treat the treatable but no CPR or intubation.  Psycho-social/Spiritual:   Desire for further Chaplaincy support:no  Additional Recommendations: Caregiving  Support/Resources  Prognosis:   Unable to determine, based on outcomes.  1 year or more  would not be surprising based on functional status, limited chronic illness burden.  Discharge Planning: To be determined, based on outcomes.  Anticipate home with home health if needed.      Primary Diagnoses: Present on Admission: . Atrial fibrillation with rapid ventricular response ([place])   I have reviewed the medical record, interviewed the patient and family, and examined the patient. The following aspects are pertinent.  Past Medical History:  Diagnosis Date  . Hemorrhoids   . Hypertension    Social History   Socioeconomic History  . Marital status: Widowed    Spouse name: Not on file  . Number of children: Not on file  . Years of education: Not on file  . Highest education level: Not on file  Occupational History  . Not on file  Tobacco Use  . Smoking status: Never Smoker  . Smokeless tobacco: Never Used  Substance and Sexual Activity  . Alcohol use: No  . Drug use: No  . Sexual activity: Never  Other Topics Concern  . Not on file  Social History Narrative  . Not on file   Social Determinants of Health   Financial Resource Strain:   . Difficulty of Paying Living Expenses: Not on file  Food Insecurity:   . Worried About Charity fundraiser in the Last Year: Not on file  . Ran Out of Food in the Last Year: Not on file  Transportation Needs:   . Lack of Transportation (Medical): Not on file  . Lack of Transportation (Non-Medical): Not on file  Physical Activity:   . Days of Exercise per Week: Not on file  . Minutes of Exercise per Session: Not on file  Stress:   . Feeling of Stress : Not on file  Social Connections:   . Frequency of Communication with Friends and Family: Not on file  . Frequency of Social Gatherings with Friends and Family: Not on file  . Attends Religious Services: Not on file  . Active Member of Clubs or Organizations: Not on file  . Attends Archivist Meetings: Not on file  . Marital Status: Not on file   History  reviewed. No pertinent family history. Scheduled Meds: . Chlorhexidine Gluconate Cloth  6 each Topical Daily  . docusate sodium  100 mg Oral BID  . metoprolol tartrate  25 mg Oral BID  . metroNIDAZOLE  500 mg Oral Q8H  . polyethylene glycol  17 g Oral Daily   Continuous Infusions: . cefTRIAXone (ROCEPHIN)  IV Stopped (04/28/20 2357)  . lactated ringers     PRN Meds:.acetaminophen **OR** acetaminophen, labetalol, ondansetron **OR** ondansetron (ZOFRAN) IV, sodium phosphate Medications Prior to Admission:  Prior to Admission medications   Not on File   No Known Allergies Review of Systems  Unable to perform ROS: Age    Physical Exam Vitals and nursing note reviewed.  Constitutional:      Comments: Looks younger than stated age  HENT:     Head: Normocephalic.     Mouth/Throat:     Mouth: Mucous membranes are moist.  Cardiovascular:     Rate and Rhythm: Tachycardia present. Rhythm irregular.  Pulmonary:     Effort: Pulmonary effort is normal. No respiratory distress.  Abdominal:     Palpations: Abdomen is soft.  Musculoskeletal:        General: No swelling.  Skin:    General: Skin is warm and dry.  Neurological:     Mental Status: She is oriented to person, place, and time.  Psychiatric:     Comments: Calm and cooperative, not fearful     Vital Signs: BP (!) 156/51   Pulse 83   Temp 98.2 F (36.8 C) (Oral)   Resp (!) 21   Ht '5\' 5"'  (1.651 m)   Wt 72.6 kg   SpO2 97%   BMI 26.63 kg/m  Pain Scale: 0-10   Pain Score: 0-No pain   SpO2: SpO2: 97 % O2 Device:SpO2: 97 % O2 Flow Rate: .O2 Flow Rate (L/min): 2 L/min  IO: Intake/output summary:   Intake/Output Summary (Last 24 hours) at 04/29/2020 1518 Last data filed at 04/29/2020 1056 Gross per 24 hour  Intake 1932.37 ml  Output 1250 ml  Net 682.37 ml    LBM: Last BM Date: 04/29/20 Baseline Weight: Weight: 72.6 kg Most recent weight: Weight: 72.6 kg     Palliative Assessment/Data:   Flowsheet Rows       Most Recent Value  Intake Tab  Referral Department Hospitalist  Unit at Time of Referral Intermediate Care Unit  Palliative Care Primary Diagnosis Other (Comment)  Date Notified 04/29/20  Palliative Care Type New Palliative care  Reason for referral Clarify Goals of Care  Date of Admission 04/28/20  Date first seen by Palliative Care 04/29/20  # of days Palliative referral response time 0 Day(s)  # of days IP prior to Palliative referral 1  Clinical Assessment  Palliative Performance Scale Score 40%  Pain Latoya last 24 hours Not able to report  Pain Min Last 24 hours Not able to report  Dyspnea Latoya Last 24 Hours Not able to report  Dyspnea Min Last 24 hours Not able to report  Psychosocial & Spiritual Assessment  Palliative Care Outcomes      Time In: 1450 Time Out: 1600  Time Total: 70 minutes  Greater than 50%  of this time was spent counseling and coordinating care related to the above assessment and plan.  Signed by: Drue Novel, NP   Please contact Palliative Medicine Team phone at [phone] for questions and concerns.  For individual provider: See Shea Evans

## 2020-04-30 ENCOUNTER — Encounter (HOSPITAL_COMMUNITY): Payer: Self-pay | Admitting: Internal Medicine

## 2020-04-30 DIAGNOSIS — R778 Other specified abnormalities of plasma proteins: Secondary | ICD-10-CM

## 2020-04-30 DIAGNOSIS — I4891 Unspecified atrial fibrillation: Secondary | ICD-10-CM | POA: Diagnosis not present

## 2020-04-30 DIAGNOSIS — I351 Nonrheumatic aortic (valve) insufficiency: Secondary | ICD-10-CM | POA: Diagnosis not present

## 2020-04-30 DIAGNOSIS — I10 Essential (primary) hypertension: Secondary | ICD-10-CM

## 2020-04-30 DIAGNOSIS — K5289 Other specified noninfective gastroenteritis and colitis: Secondary | ICD-10-CM | POA: Diagnosis not present

## 2020-04-30 DIAGNOSIS — K625 Hemorrhage of anus and rectum: Secondary | ICD-10-CM | POA: Diagnosis not present

## 2020-04-30 LAB — BASIC METABOLIC PANEL
Anion gap: 9 (ref 5–15)
BUN: 15 mg/dL (ref 8–23)
CO2: 25 mmol/L (ref 22–32)
Calcium: 7.9 mg/dL — ABNORMAL LOW (ref 8.9–10.3)
Chloride: 99 mmol/L (ref 98–111)
Creatinine, Ser: 0.78 mg/dL (ref 0.44–1.00)
GFR, Estimated: >60 mL/min (ref >60)
Glucose, Bld: 104 mg/dL — ABNORMAL HIGH (ref 70–99)
Potassium: 4.4 mmol/L (ref 3.5–5.1)
Sodium: 133 mmol/L — ABNORMAL LOW (ref 135–145)

## 2020-04-30 LAB — HEMOGLOBIN AND HEMATOCRIT, BLOOD
HCT: 41.7 % (ref 36.0–46.0)
HCT: 44.4 % (ref 36.0–46.0)
HCT: 41.9 % (ref 36.0–46.0)
HCT: 39.8 % (ref 36.0–46.0)
Hemoglobin: 13 g/dL (ref 12.0–15.0)
Hemoglobin: 14.1 g/dL (ref 12.0–15.0)
Hemoglobin: 13.3 g/dL (ref 12.0–15.0)
Hemoglobin: 12.4 g/dL (ref 12.0–15.0)

## 2020-04-30 LAB — CBC
HCT: 41.3 % (ref 36.0–46.0)
Hemoglobin: 12.8 g/dL (ref 12.0–15.0)
MCH: 28.9 pg (ref 26.0–34.0)
MCHC: 31 g/dL (ref 30.0–36.0)
MCV: 93.2 fL (ref 80.0–100.0)
Platelets: 247 10*3/uL (ref 150–400)
RBC: 4.43 MIL/uL (ref 3.87–5.11)
RDW: 15 % (ref 11.5–15.5)
WBC: 8.5 10*3/uL (ref 4.0–10.5)
nRBC: 0 % (ref 0.0–0.2)

## 2020-04-30 LAB — MAGNESIUM: Magnesium: 2.1 mg/dL (ref 1.7–2.4)

## 2020-04-30 LAB — LIPID PANEL
Cholesterol: 120 mg/dL (ref 0–200)
HDL: 43 mg/dL (ref >40)
LDL Cholesterol: 68 mg/dL (ref 0–99)
Total CHOL/HDL Ratio: 2.8 RATIO
Triglycerides: 44 mg/dL (ref <150)
VLDL: 9 mg/dL (ref 0–40)

## 2020-04-30 LAB — GLUCOSE, CAPILLARY: Glucose-Capillary: 174 mg/dL — ABNORMAL HIGH (ref 70–99)

## 2020-04-30 MED ORDER — BOOST / RESOURCE BREEZE PO LIQD CUSTOM
1.0000 | Freq: Three times a day (TID) | ORAL | Status: DC
  Administered 2020-04-30 – 2020-05-04 (×13): 1 via ORAL

## 2020-04-30 MED ORDER — PEG 3350-KCL-NA BICARB-NACL 420 G PO SOLR
4000.0000 mL | Freq: Once | ORAL | Status: AC
  Administered 2020-04-30: 4000 mL via ORAL

## 2020-04-30 NOTE — Progress Notes (Signed)
PROGRESS NOTE   Latoya Long  NKN:[id number] DOB: 12/09/1927 DOA: 04/28/2020 PCP: Rory Percy, MD   Chief Complaint  Patient presents with  . Diarrhea   Brief Admission History:  86 y.o. female with medical history significant for hypertension.  Patient has been having multiple continuous watery stools since yesterday.  Yesterday and today patient had small amounts of blood in stool.  No abdominal pain.  Patient was weak, today and unable to ambulate.  Patient has a history of chronic constipation.  Patient was seen at [facility] ER yesterday and diagnosed with UTI but not so not given any medications.  Daughter was concerned about stool C. difficile and brought patient to the ED here.  Patient and daughter deny history of stroke, no cardiac history.  No history of irregular heart rhythm.  Denies history of frequent falls, daughter reports 2 falls over the past few years which are mechanical falls.  Patient never smoked cigarettes, denies difficulty breathing or cough.  No chest pain.  No lower extremity swelling, no recent trips.  Denies Dysuria or lower abdominal pain. ED Course: heart rate initially in the 90s increased to 159.  O2 sats initially greater than 92% on room air, after procedure dropped to mid 80s placed on 3 L nasal cannula.  EKG showing atrial fibrillation with RVR.  Magnesium 2.2.  WBC 11.9.  Large stool in the rectal vault concerning for stercoral colitis.  Fecal disimpaction attempted in the ED largely unsuccessful, with small amount of blood in stool.  Cardizem drip started.  500 mill bolus given.  Hospitalist admit for atrial fibrillation with RVR, and stercoral colitis and rectal bleeding.  Assessment & Plan:   Principal Problem:   Atrial fibrillation with rapid ventricular response (HCC) Active Problems:   Stercoral colitis   HTN (hypertension)   Rectal bleeding   Goals of care, counseling/discussion   Palliative care by specialist   DNR (do not resuscitate)  discussion  1. AFib/flutter with RVR - likely reactive to acute stressors of disimpaction in ED, she initially responded well to the IV cardizem but has been having intermittently going in and out of sinus rhythm and afib/flutter.  Echo EF 55-60% with indeterminate diastolic parameters.  TSH WNL. We had been holding anticoagulation due to rectal bleeding.  2. Essential hypertension - she is on IV diltiazem at this time.  3. Rectal bleeding - appreciate GI consult and recommendations.  4. Stercoral ulcer - she is on antibiotics.  5. Chronic constipation with fecal impaction - disimpacted in ED, appreciate GI recs for an appropriate bowel regimen.   6. Advanced care planning - appreciate palliative medicine consultation. Pt is DNR code status updated 11/15.  7. Acute respiratory failure with hypoxia     DVT prophylaxis:  SCDs Code Status: DNR Family Communication: daughter at bedside updated 11/15, 11/16  Disposition: home   Status is: Inpatient  Remains inpatient appropriate because:IV treatments appropriate due to intensity of illness or inability to take PO and Inpatient level of care appropriate due to severity of illness  Dispo: The patient is from: Home              Anticipated d/c is to: Home              Anticipated d/c date is: 2 days              Patient currently is not medically stable to d/c.  Consultants:   GI  Palliative   Procedures:  Antimicrobials:  ceftriaxone  metronidazole  Subjective: Pt had a difficult time sleeping overnight in the hospital. Had some confusion and didn't take her evening metoprolol.     Objective: Vitals:   04/30/20 0752 04/30/20 0800 04/30/20 0805 04/30/20 0810  BP:  128/65    Pulse: (!) 111 (!) 103 (!) 29 80  Resp: (!) 21 (!) 23 (!) 21 20  Temp: 98.9 F (37.2 C)     TempSrc: Axillary     SpO2: 96% 96% 96% 95%  Weight:      Height:        Intake/Output Summary (Last 24 hours) at 04/30/2020 1108 Last data filed at  04/30/2020 0905 Gross per 24 hour  Intake 1542.53 ml  Output 275 ml  Net 1267.53 ml   Filed Weights   04/28/20 1544 04/30/20 0500  Weight: 72.6 kg 72.4 kg   Examination:  General exam: Appears calm and comfortable, appears younger than stated age.  Respiratory system: Clear to auscultation. Respiratory effort normal. Cardiovascular system: irregularly irregular, S1 & S2 heard. No JVD, murmurs, rubs, gallops or clicks. No pedal edema. Gastrointestinal system: Abdomen is mildly distended, soft and nontender. No organomegaly or masses felt. Normal bowel sounds heard.  Pt oozing small amount of blood and stool from rectum.   Central nervous system: Alert and oriented. No focal neurological deficits. Extremities: Symmetric 5 x 5 power. Skin: No rashes, lesions or ulcers Psychiatry: Judgement and insight appear normal. Mood & affect appropriate.   Data Reviewed: I have personally reviewed following labs and imaging studies  CBC: Recent Labs  Lab 04/28/20 1749 04/28/20 1749 04/29/20 0316 04/29/20 0810 04/29/20 1105 04/30/20 0538 04/30/20 1006  WBC 11.9*  --   --  9.5 8.8 8.5  --   NEUTROABS 10.0*  --   --   --  7.1  --   --   HGB 14.0   < > 12.5 13.7 12.5 12.8 13.0  HCT 44.2   < > 40.1 44.0 39.0 41.3 41.7  MCV 93.8  --   --  95.2 92.6 93.2  --   PLT 248  --   --  212 220 247  --    < > = values in this interval not displayed.    Basic Metabolic Panel: Recent Labs  Lab 04/28/20 1749 04/29/20 0810 04/30/20 0538  NA 135 135 133*  K 4.2 4.1 4.4  CL 103 103 99  CO2 23 21* 25  GLUCOSE 126* 90 104*  BUN 24* 20 15  CREATININE 0.93 0.76 0.78  CALCIUM 8.1* 8.1* 7.9*  MG 2.2  --  2.1    GFR: Estimated Creatinine Clearance: 46.6 mL/min (by C-G formula based on SCr of 0.78 mg/dL).  Liver Function Tests: Recent Labs  Lab 04/28/20 1749  AST 28  ALT 21  ALKPHOS 70  BILITOT 1.0  PROT 6.4*  ALBUMIN 3.1*    CBG: No results for input(s): GLUCAP in the last 168  hours.  Recent Results (from the past 240 hour(s))  Respiratory Panel by RT PCR (Flu A&B, Covid) -     Status: None   Collection Time: 04/28/20  4:23 PM  Result Value Ref Range Status   SARS Coronavirus 2 by RT PCR NEGATIVE NEGATIVE Final    Comment: (NOTE) SARS-CoV-2 target nucleic acids are NOT DETECTED.  The SARS-CoV-2 RNA is generally detectable in upper respiratoy specimens during the acute phase of infection. The lowest concentration of SARS-CoV-2 viral copies this assay can detect is  131 copies/mL. A negative result does not preclude SARS-Cov-2 infection and should not be used as the sole basis for treatment or other patient management decisions. A negative result may occur with  improper specimen collection/handling, submission of specimen other than nasopharyngeal swab, presence of viral mutation(s) within the areas targeted by this assay, and inadequate number of viral copies (<131 copies/mL). A negative result must be combined with clinical observations, patient history, and epidemiological information. The expected result is Negative.  Fact Sheet for Patients:  PinkCheek.be  Fact Sheet for Healthcare Providers:  GravelBags.it  This test is no t yet approved or cleared by the [place] FDA and  has been authorized for detection and/or diagnosis of SARS-CoV-2 by FDA under an Emergency Use Authorization (EUA). This EUA will remain  in effect (meaning this test can be used) for the duration of the COVID-19 declaration under Section 564(b)(1) of the Act, 21 U.S.C. section 360bbb-3(b)(1), unless the authorization is terminated or revoked sooner.     Influenza A by PCR NEGATIVE NEGATIVE Final   Influenza B by PCR NEGATIVE NEGATIVE Final    Comment: (NOTE) The Xpert Xpress SARS-CoV-2/FLU/RSV assay is intended as an aid in  the diagnosis of influenza from Nasopharyngeal swab specimens and  should not be used as  a sole basis for treatment. Nasal washings and  aspirates are unacceptable for Xpert Xpress SARS-CoV-2/FLU/RSV  testing.  Fact Sheet for Patients: PinkCheek.be  Fact Sheet for Healthcare Providers: GravelBags.it  This test is not yet approved or cleared by the [place] FDA and  has been authorized for detection and/or diagnosis of SARS-CoV-2 by  FDA under an Emergency Use Authorization (EUA). This EUA will remain  in effect (meaning this test can be used) for the duration of the  Covid-19 declaration under Section 564(b)(1) of the Act, 21  U.S.C. section 360bbb-3(b)(1), unless the authorization is  terminated or revoked. Performed at [facility], [address], [place], [place] [address]   MRSA PCR Screening     Status: None   Collection Time: 04/29/20  2:21 PM   Specimen: Nasal Mucosa; Nasopharyngeal  Result Value Ref Range Status   MRSA by PCR NEGATIVE NEGATIVE Final    Comment:        The GeneXpert MRSA Assay (FDA approved for NASAL specimens only), is one component of a comprehensive MRSA colonization surveillance program. It is not intended to diagnose MRSA infection nor to guide or monitor treatment for MRSA infections. Performed at [facility], [address] [place], [place] [address]      Radiology Studies: DG Chest 1 View  Result Date: 04/28/2020 CLINICAL DATA:  AFib EXAM: CHEST  1 VIEW COMPARISON:  April 27, 2020 FINDINGS: The heart size is stable. There is a trace right-sided pleural effusion. There is a persistent airspace opacity at the left lung base. The heart size is borderline enlarged. Aortic calcifications are noted. There is no pneumothorax. There is mild vascular congestion without overt pulmonary edema. IMPRESSION: Trace right-sided pleural effusion and bibasilar atelectasis. Electronically Signed   By: Constance Holster M.D.   On: 04/28/2020 23:30   CT ABDOMEN PELVIS W  CONTRAST  Result Date: 04/28/2020 CLINICAL DATA:  86 year old female with concern for acute diverticulitis. EXAM: CT ABDOMEN AND PELVIS WITH CONTRAST TECHNIQUE: Multidetector CT imaging of the abdomen and pelvis was performed using the standard protocol following bolus administration of intravenous contrast. CONTRAST:  172mL OMNIPAQUE IOHEXOL 300 MG/ML  SOLN COMPARISON:  CT abdomen pelvis dated 11/06/2013. FINDINGS:  Lower chest: There are bibasilar dependent atelectasis. There is a cluster of density in the left lower lobe which may represent atelectasis or infiltrate. Clinical correlation and follow-up to resolution recommended. No intra-abdominal free air or free fluid. Hepatobiliary: No focal liver abnormality is seen. No gallstones, gallbladder wall thickening, or biliary dilatation. Pancreas: Unremarkable. No pancreatic ductal dilatation or surrounding inflammatory changes. Spleen: Normal in size without focal abnormality. Adrenals/Urinary Tract: The adrenal glands unremarkable. There is no hydronephrosis on either side. There is symmetric enhancement and excretion of contrast by both kidneys. The visualized ureters and urinary bladder appear unremarkable. Stomach/Bowel: There is large stool in the rectal vault. There is inflammatory changes of the perirectal fat concerning for stercoral colitis. Clinical correlation is recommended. There is no bowel obstruction. The appendix is normal. Vascular/Lymphatic: Advanced aortoiliac atherosclerotic disease. The IVC is unremarkable. No portal venous gas. There is no adenopathy. Reproductive: There is a 1 cm anterior fundal calcified fibroid. The ovaries are unremarkable. Other: Stranding and inflammatory changes of the presacral fat likely related to inflammatory/infectious process involving the rectal vault. No drainable fluid collection. Musculoskeletal: Osteopenia with extensive degenerative changes of the spine. Bilateral L5 pars defects with grade 1 L5-S1  anterolisthesis. Old-appearing compression fracture of the superior endplate of L1 with approximately 50% loss of vertebral body height. No acute osseous pathology. IMPRESSION: Large stool in the rectal vault with findings concerning for stercoral colitis. Clinical correlation is recommended. No bowel obstruction. Normal appendix. Electronically Signed   By: Anner Crete M.D.   On: 04/28/2020 19:05   ECHOCARDIOGRAM COMPLETE  Result Date: 04/29/2020    ECHOCARDIOGRAM REPORT   Patient Name:   LENNIX ROTUNDO Date of Exam: 04/29/2020 Medical Rec #:  [id number]      Height:       65.0 in Accession #:    [id number]     Weight:       160.0 lb Date of Birth:  21-Feb-1928      BSA:          1.799 m Patient Age:    13 years       BP:           119/32 mmHg Patient Gender: F              HR:           86 bpm. Exam Location:  [facility] Procedure: 2D Echo Indications:    Atrial Fibrillation 427.31 / I48.91  History:        Patient has no prior history of Echocardiogram examinations.                 Arrythmias:Atrial Fibrillation; Risk Factors:Hypertension and                 Non-Smoker.  Sonographer:    Leavy Cella RDCS (AE) Referring Phys: [place]  1. Left ventricular ejection fraction, by estimation, is 55 to 60%. The left ventricle has normal function. The left ventricle has no regional wall motion abnormalities. There is mild left ventricular hypertrophy. Left ventricular diastolic parameters are indeterminate.  2. Right ventricular systolic function is normal. The right ventricular size is normal. Tricuspid regurgitation signal is inadequate for assessing PA pressure.  3. The mitral valve is grossly normal. Trivial mitral valve regurgitation.  4. The aortic valve is tricuspid. Aortic valve regurgitation is mild. Aortic regurgitation PHT measures 574 msec.  5. The inferior vena cava is dilated in size with <50% respiratory variability,  suggesting right atrial pressure of 15 mmHg.  6.  Telemetry not recorded during study. FINDINGS  Left Ventricle: Left ventricular ejection fraction, by estimation, is 55 to 60%. The left ventricle has normal function. The left ventricle has no regional wall motion abnormalities. The left ventricular internal cavity size was normal in size. There is  mild left ventricular hypertrophy. Left ventricular diastolic parameters are indeterminate. Right Ventricle: The right ventricular size is normal. No increase in right ventricular wall thickness. Right ventricular systolic function is normal. Tricuspid regurgitation signal is inadequate for assessing PA pressure. Left Atrium: Left atrial size was normal in size. Right Atrium: Right atrial size was normal in size. Pericardium: There is no evidence of pericardial effusion. Mitral Valve: The mitral valve is grossly normal. Mild mitral annular calcification. Trivial mitral valve regurgitation. Tricuspid Valve: The tricuspid valve is grossly normal. Tricuspid valve regurgitation is trivial. Aortic Valve: The aortic valve is tricuspid. There is mild aortic valve annular calcification. Aortic valve regurgitation is mild. Aortic regurgitation PHT measures 574 msec. Pulmonic Valve: The pulmonic valve was grossly normal. Pulmonic valve regurgitation is trivial. Aorta: The aortic root is normal in size and structure. Venous: The inferior vena cava is dilated in size with less than 50% respiratory variability, suggesting right atrial pressure of 15 mmHg. IAS/Shunts: No atrial level shunt detected by color flow Doppler.  LEFT VENTRICLE PLAX 2D LVIDd:         4.04 cm  Diastology LVIDs:         2.96 cm  LV e' medial:    4.78 cm/s LV PW:         1.18 cm  LV E/e' medial:  16.8 LV IVS:        1.19 cm  LV e' lateral:   7.14 cm/s LVOT diam:     2.10 cm  LV E/e' lateral: 11.2 LVOT Area:     3.46 cm  RIGHT VENTRICLE RV S prime:     16.00 cm/s TAPSE (M-mode): 2.6 cm LEFT ATRIUM             Index       RIGHT ATRIUM           Index LA diam:         3.50 cm 1.95 cm/m  RA Area:     14.70 cm LA Vol (A2C):   53.8 ml 29.90 ml/m RA Volume:   45.10 ml  25.07 ml/m LA Vol (A4C):   55.7 ml 30.96 ml/m LA Biplane Vol: 56.4 ml 31.35 ml/m  AORTIC VALVE AI PHT:      574 msec  AORTA Ao Root diam: 2.80 cm MITRAL VALVE MV Area (PHT): 2.99 cm    SHUNTS MV Decel Time: 254 msec    Systemic Diam: 2.10 cm MV E velocity: 80.10 cm/s MV A velocity: 87.80 cm/s MV E/A ratio:  0.91 Rozann Lesches MD Electronically signed by Rozann Lesches MD Signature Date/Time: 04/29/2020/11:39:37 AM    Final    Scheduled Meds: . Chlorhexidine Gluconate Cloth  6 each Topical Daily  . docusate sodium  100 mg Oral BID  . polyethylene glycol  17 g Oral Daily   Continuous Infusions: . diltiazem (CARDIZEM) infusion 5 mg/hr (04/30/20 0905)  . lactated ringers       LOS: 2 days   Critical Care Procedure Note Authorized and Performed by: Murvin Natal MD  Total Critical Care time:  31 mins Due to a high probability of clinically significant, life threatening deterioration, the patient  required my highest level of preparedness to intervene emergently and I personally spent this critical care time directly and personally managing the patient.  This critical care time included obtaining a history; examining the patient, pulse oximetry; ordering and review of studies; arranging urgent treatment with development of a management plan; evaluation of patient's response of treatment; frequent reassessment; and discussions with other providers.  This critical care time was performed to assess and manage the high probability of imminent and life threatening deterioration that could result in multi-organ failure.  It was exclusive of separately billable procedures and treating other patients and teaching time.    Irwin Brakeman, MD How to contact the [facility] Attending or Consulting provider [place] or covering provider during after hours [place], for this patient?  1. Check the care team in [facility]  and look for a) attending/consulting [facility] provider listed and b) the [facility] team listed 2. Log into [email or web address] and use [place] universal password to access. If you do not have the password, please contact the hospital operator. 3. Locate the [facility] provider you are looking for under [facility] and page to a number that you can be directly reached. 4. If you still have difficulty reaching the provider, please page the [facility] (Director on Call) for the Hospitalists listed on amion for assistance.  04/30/2020, 11:08 AM

## 2020-04-30 NOTE — Evaluation (Signed)
Physical Therapy Evaluation Patient Details Name: Latoya Long MRN: [id number] DOB: 02-08-1928 Today's Date: 04/30/2020   History of Present Illness  Latoya Long is a 86 y.o. female with medical history significant for hypertension.  Patient has been having multiple continuous watery stools since yesterday.  Yesterday and today patient had small amounts of blood in stool.  No abdominal pain.  Patient was weak, today and unable to ambulate.  Patient has a history of chronic constipation.  Patient was seen at [facility] ER yesterday and diagnosed with UTI but not so not given any medications.  Daughter was concerned about stool C. difficile and brought patient to the ED here. Patient and daughter deny history of stroke, no cardiac history.  No history of irregular heart rhythm.  Denies history of frequent falls, daughter reports 2 falls over the past few years which are mechanical falls. Patient never smoked cigarettes, denies difficulty breathing or cough.  No chest pain.  No lower extremity swelling, no recent trips. Denies Dysuria or lower abdominal pain.    Clinical Impression  Patient demonstrates slow labored movement for sitting up at bedside, incontinent of stool, very unsteady on feet and at high risk for falls due to BLE weakness and poor standing balance.  Patient limited to a few side steps at bedside and tolerated sitting up in chair after therapy with her daughter present in room.  Patient will benefit from continued physical therapy in hospital and recommended venue below to increase strength, balance, endurance for safe ADLs and gait.     Follow Up Recommendations SNF    Equipment Recommendations  None recommended by PT    Recommendations for Other Services       Precautions / Restrictions Precautions Precautions: Fall Restrictions Weight Bearing Restrictions: No      Mobility  Bed Mobility Overal bed mobility: Needs Assistance Bed Mobility: Supine to Sit     Supine to  sit: Mod assist     General bed mobility comments: increased time, labored movement    Transfers Overall transfer level: Needs assistance Equipment used: Rolling walker (2 wheeled) Transfers: Sit to/from [facility] Sit to Stand: Mod assist Stand pivot transfers: Mod assist       General transfer comment: slow labored movement  Ambulation/Gait Ambulation/Gait assistance: Mod assist;Max assist Gait Distance (Feet): 4 Feet Assistive device: Rolling walker (2 wheeled) Gait Pattern/deviations: Decreased step length - left;Decreased step length - right;Decreased stride length Gait velocity: decreased   General Gait Details: limited to 4-5 slow labored unsteady side steps at bedside due to BLE weakness, poor standing balance  Stairs            Wheelchair Mobility    Modified Rankin (Stroke Patients Only)       Balance Overall balance assessment: Needs assistance Sitting-balance support: Feet supported;No upper extremity supported Sitting balance-Leahy Scale: Fair Sitting balance - Comments: seated at EOB   Standing balance support: During functional activity;Bilateral upper extremity supported Standing balance-Leahy Scale: Poor Standing balance comment: fair/poor using RW                             Pertinent Vitals/Pain Pain Assessment: No/denies pain    Home Living Family/patient expects to be discharged to:: Private residence Living Arrangements: Children Available Help at Discharge: Family;Available PRN/intermittently Type of Home: Apartment Home Access: Ramped entrance     Home Layout: One level Home Equipment: Walker - 4 wheels;Transport chair;Tub bench;Bedside commode;Grab  bars - toilet;Grab bars - tub/shower      Prior Function Level of Independence: Independent with assistive device(s)         Comments: household     Hand Dominance        Extremity/Trunk Assessment   Upper Extremity Assessment Upper  Extremity Assessment: Generalized weakness    Lower Extremity Assessment Lower Extremity Assessment: Generalized weakness    Cervical / Trunk Assessment Cervical / Trunk Assessment: Normal  Communication   Communication: No difficulties  Cognition Arousal/Alertness: Awake/alert Behavior During Therapy: WFL for tasks assessed/performed Overall Cognitive Status: Within Functional Limits for tasks assessed                                        General Comments      Exercises     Assessment/Plan    PT Assessment Patient needs continued PT services  PT Problem List Decreased strength;Decreased activity tolerance;Decreased balance;Decreased mobility       PT Treatment Interventions DME instruction;Gait training;Functional mobility training;Therapeutic activities;Therapeutic exercise;Balance training;Patient/family education    PT Goals (Current goals can be found in the Care Plan section)  Acute Rehab PT Goals Patient Stated Goal: return home with family to assist PT Goal Formulation: With patient Time For Goal Achievement: 05/14/20 Potential to Achieve Goals: Good    Frequency Min 3X/week   Barriers to discharge        Co-evaluation               AM-PAC PT "6 Clicks" Mobility  Outcome Measure Help needed turning from your back to your side while in a flat bed without using bedrails?: A Lot Help needed moving from lying on your back to sitting on the side of a flat bed without using bedrails?: A Lot Help needed moving to and from a bed to a chair (including a wheelchair)?: A Lot Help needed standing up from a chair using your arms (e.g., wheelchair or bedside chair)?: A Lot Help needed to walk in hospital room?: A Lot Help needed climbing 3-5 steps with a railing? : Total 6 Click Score: 11    End of Session   Activity Tolerance: Patient tolerated treatment well;Patient limited by fatigue Patient left: in chair;with call bell/phone within  reach;with family/visitor present Nurse Communication: Mobility status PT Visit Diagnosis: Unsteadiness on feet (R26.81);Other abnormalities of gait and mobility (R26.89);Muscle weakness (generalized) (M62.81)    Time: 4010-2725 PT Time Calculation (min) (ACUTE ONLY): 33 min   Charges:   PT Evaluation $PT Eval Moderate Complexity: 1 Mod PT Treatments $Therapeutic Activity: 23-37 mins        12:39 PM, 04/30/20 Lonell Grandchild, MPT Physical Therapist with [facility] [phone] office [phone] mobile phone

## 2020-04-30 NOTE — Progress Notes (Signed)
Initial Nutrition Assessment  DOCUMENTATION CODES:   Not applicable  INTERVENTION:  Boost Breeze po TID, each supplement provides 250 kcal and 9 grams of protein  Liberalize diet as medically feasible   NUTRITION DIAGNOSIS:   Inadequate oral intake related to decreased appetite as evidenced by per patient/family report (diminished po intake x 1 week).    GOAL:   Patient will meet greater than or equal to 90% of their needs    MONITOR:   PO intake, Weight trends, Labs, I & O's, Diet advancement, Supplement acceptance  REASON FOR ASSESSMENT:   Rounds    ASSESSMENT:   86 year old female with history significant of HTN, chronic constipation, and hemorrhoids presented with multiple episodes of watery stools and weakness. Pt admitted with atrial fibrillation with RVR and stercoral colitis.  Patient out of bed sitting in recliner this morning, daughter present in room. She reports feeling some better today and is hungry. Daughter reports excellent appetite at baseline. Daughter denies any chewing/swallowing difficulties, reports pt eats a regular diet at home. Appetite has been poor over the past week, states she just didn't feel like eating much, reports only having a bite of her birthday cake. She recalls drinking some of her chicken broth and had consumed ~ 90% of grape juice prior to RD visit.  Meal completion 50% x 2 documented intakes. Patient agreeable to drinking Boost Breeze to aid with meeting needs.    Current wt 159.28 lbs No recent weight history for review, noted 78.5 kg (172.7 lbs) on 06/13/15.   I/Os: +1919.6 ml since admit UOP: 925 ml x 24 hrs  Medications reviewed and include: Colace, Nulytely Drips:  Cardizem 125 mg in D5 @ 5 mg/hr  Labs: CBGs 174, Na 133 (L)  NUTRITION - FOCUSED PHYSICAL EXAM: Mild orbital, upper arm, buccal fat depletion; Mild temple, clavicle, dorsal hand muscle depletion   Diet Order:   Diet Order            Diet clear liquid  Room service appropriate? Yes; Fluid consistency: Thin  Diet effective now                 EDUCATION NEEDS:   Education needs have been addressed  Skin:  Skin Assessment: Reviewed RN Assessment  Last BM:  11/16-type 6  Height:   Ht Readings from Last 1 Encounters:  04/28/20 5\' 5"  (1.651 m)    Weight:   Wt Readings from Last 1 Encounters:  04/30/20 72.4 kg    BMI:  Body mass index is 26.56 kg/m.  Estimated Nutritional Needs:   Kcal:  1600-1800  Protein:  70-80  Fluid:  >/= 1.6 L/day    Lajuan Lines, RD, LDN Clinical Nutrition After Hours/Weekend Pager # in [place]

## 2020-04-30 NOTE — Consult Note (Addendum)
Cardiology Consultation:   Patient ID: Latoya Long MRN: [id number]; DOB: 07/23/1927  Admit date: 04/28/2020 Date of Consult: 04/30/2020  Primary Care Provider: Rory Percy, MD [facility] Cardiologist: Dorris Carnes, MD (new) [place] Electrophysiologist:  None    Patient Profile:   Latoya Long is a 86 y.o. female with a hx of HTN who is being seen today for the evaluation of paroxysmal atrial fib at the request of Dr. Wynetta Emery.  History of Present Illness:   Latoya Long has no prior cardiac history and very minimal PMH aside from HTN and she isn't even on any antihypertensives now. Her daughter Latoya Long is at bedside who has been staying with her for several months. The patient ambulates with a walker at baseline and last fall was several years ago after tripping on a rug (which Latoya Long has since removed).  The patient was seen at the [facility] ED 04/27/20 for several days of loose muddy stools, nausea, poor oral intake and weakness. Her workup and abdominal plain film felt consistent with acute enteritis/moderate stool burden and dehydration. There was some concern for C. Difficile colitis, to be considered for outpatient treatment (does not appear rx started). Daughter brought patient to [facility] for additional evaluation 04/28/20. There was large stool in the rectal vault concerning for stercoral colitis. Fecal disimpaction attempted in the ED was largely unsuccessful with small amounts of blood in the stool. Her O2 sat was initially >92% on RA but dropped to mid 80s after prcoedure. Initial EKG was NSR but subsequently noted to be in rapid atrial fibrillation with RVR. It appears she has been intermittently treated with IV diltiazem bolus (10mg  on 11/14, 11/15), diltiazem drip (11/14, restarted 11/15, and given 12.5mg  metoprolol on 11/15. She is also treated with metronidazole. Last BP 128/65. Telemetry reveals NSR with paroxysms of atrial fib and atrial flutter  with run of a faster tachycardia initially felt to be NSVT although she does not change axis so may represent SVT (this does not match the morphology of her rare PVCs). Labs are notable for mildly elevated/flat troponin at 272-267 in the absence of any chest pain, normal CBC, mild hyponatremia (133), hypocalcemia 7.9, normal TSH, Covid negative. 2D echo 04/29/20 showed normal EF, mild LVH, mild AI.  She denies any awareness of her atrial fibrillation and denies any chest pain. No dyspnea, edema or orthonpea. She refused her oral meds last night likely out of some confusion. She habitually locks her doors at night and was concerned that she could not lock her ICU doors. When the nurse came in to present oral medication she was very skeptical and turned them away. She is now more oriented although did get her daughter confused for her mother at first. She reports she hadn't slept well in a few days. Palliative care has also been consulted to assist with goals of care, with decision to pursue DNR status and agreeable to sigmoidoscopy if needed for evaluation of bleeding.  Past Medical History:  Diagnosis Date  . Hemorrhoids   . Hypertension     Past Surgical History:  Procedure Laterality Date  . TONSILLECTOMY       Home Medications:  Prior to Admission medications   Not on File    Inpatient Medications: Scheduled Meds: . Chlorhexidine Gluconate Cloth  6 each Topical Daily  . docusate sodium  100 mg Oral BID  . metroNIDAZOLE  500 mg Oral Q8H  . polyethylene glycol  17 g Oral Daily   Continuous  Infusions: . cefTRIAXone (ROCEPHIN)  IV Stopped (04/29/20 2320)  . diltiazem (CARDIZEM) infusion 5 mg/hr (04/30/20 0842)  . lactated ringers     PRN Meds: acetaminophen **OR** acetaminophen, labetalol, ondansetron **OR** ondansetron (ZOFRAN) IV, sodium phosphate  Allergies:   No Known Allergies  Social History:   Social History   Socioeconomic History  . Marital status: Widowed    Spouse  name: Not on file  . Number of children: Not on file  . Years of education: Not on file  . Highest education level: Not on file  Occupational History  . Not on file  Tobacco Use  . Smoking status: Never Smoker  . Smokeless tobacco: Never Used  Substance and Sexual Activity  . Alcohol use: No  . Drug use: No  . Sexual activity: Never  Other Topics Concern  . Not on file  Social History Narrative  . Not on file   Social Determinants of Health   Financial Resource Strain:   . Difficulty of Paying Living Expenses: Not on file  Food Insecurity:   . Worried About Charity fundraiser in the Last Year: Not on file  . Ran Out of Food in the Last Year: Not on file  Transportation Needs:   . Lack of Transportation (Medical): Not on file  . Lack of Transportation (Non-Medical): Not on file  Physical Activity:   . Days of Exercise per Week: Not on file  . Minutes of Exercise per Session: Not on file  Stress:   . Feeling of Stress : Not on file  Social Connections:   . Frequency of Communication with Friends and Family: Not on file  . Frequency of Social Gatherings with Friends and Family: Not on file  . Attends Religious Services: Not on file  . Active Member of Clubs or Organizations: Not on file  . Attends Archivist Meetings: Not on file  . Marital Status: Not on file  Intimate Partner Violence:   . Fear of Current or Ex-Partner: Not on file  . Emotionally Abused: Not on file  . Physically Abused: Not on file  . Sexually Abused: Not on file    Family History:   Family History  Problem Relation Age of Onset  . Stroke Sister      ROS:  Please see the history of present illness.   All other ROS reviewed and negative.     Physical Exam/Data:   Vitals:   04/30/20 0752 04/30/20 0800 04/30/20 0805 04/30/20 0810  BP:  128/65    Pulse: (!) 111 (!) 103 (!) 29 80  Resp: (!) 21 (!) 23 (!) 21 20  Temp: 98.9 F (37.2 C)     TempSrc: Axillary     SpO2: 96% 96% 96%  95%  Weight:      Height:        Intake/Output Summary (Last 24 hours) at 04/30/2020 0844 Last data filed at 04/30/2020 0300 Gross per 24 hour  Intake 1800.34 ml  Output 275 ml  Net 1525.34 ml   Last 3 Weights 04/30/2020 04/28/2020 06/13/2015  Weight (lbs) 159 lb 9.8 oz 160 lb 173 lb  Weight (kg) 72.4 kg 72.576 kg 78.472 kg     Body mass index is 26.56 kg/m.  Vital Signs. BP 128/65   Pulse 80   Temp 98.9 F (37.2 C) (Axillary)   Resp 20   Ht 5\' 5"  (1.651 m)   Wt 72.4 kg   SpO2 95%   BMI 26.56 kg/m  General: Well developed, well nourished WF, in no acute distress. Head: Normocephalic, atraumatic, sclera non-icteric, no xanthomas, nares are without discharge. Neck: Negative for carotid bruits. JVP not elevated. Lungs: Clear bilaterally to auscultation without wheezes, rales, or rhonchi. Breathing is unlabored. Heart: RRR S1 S2 without murmurs, rubs, or gallops.  Abdomen: Soft, non-tender, non-distended with normoactive bowel sounds. No rebound/guarding. Extremities: No clubbing or cyanosis. No edema. Distal pedal pulses are 2+ and equal bilaterally. Neuro: Alert and oriented to self, place. Confused over daughter's identity, knows the year and that she had a recent birthday. Moves all extremities spontaneously. Psych:  Responds to questions appropriately with a normal affect.  EKG:  The EKG was personally reviewed and demonstrates:  NSR 95bpm LVH with repolarization abnormality with TW changes in I, avL, V5-V6, incomplete LBBB, Q waves II, avF, V1-V3 (Q waves present on 2015 tracing, nonspecific TW changes are new in context of LVH)  Telemetry:  Telemetry was personally reviewed and demonstrates:  NSR with paroxysms of atrial fib and possible atrial flutter with run of a faster tachycardia initially felt to be NSVT although she does not change axis so may represent SVT (this does not match the morphology of her rare PVCs)  Relevant CV Studies: 2D echo 04/29/20   1. Left  ventricular ejection fraction, by estimation, is 55 to 60%. The  left ventricle has normal function. The left ventricle has no regional  wall motion abnormalities. There is mild left ventricular hypertrophy.  Left ventricular diastolic parameters  are indeterminate.  2. Right ventricular systolic function is normal. The right ventricular  size is normal. Tricuspid regurgitation signal is inadequate for assessing  PA pressure.  3. The mitral valve is grossly normal. Trivial mitral valve  regurgitation.  4. The aortic valve is tricuspid. Aortic valve regurgitation is mild.  Aortic regurgitation PHT measures 574 msec.  5. The inferior vena cava is dilated in size with <50% respiratory  variability, suggesting right atrial pressure of 15 mmHg.  6. Telemetry not recorded during study.   Laboratory Data:  High Sensitivity Troponin:   Recent Labs  Lab 04/28/20 2325 04/29/20 0316  TROPONINIHS 272* 267*     Chemistry Recent Labs  Lab 04/28/20 1749 04/29/20 0810 04/30/20 0538  NA 135 135 133*  K 4.2 4.1 4.4  CL 103 103 99  CO2 23 21* 25  GLUCOSE 126* 90 104*  BUN 24* 20 15  CREATININE 0.93 0.76 0.78  CALCIUM 8.1* 8.1* 7.9*  GFRNONAA 58* >60 >60  ANIONGAP 9 11 9     Recent Labs  Lab 04/28/20 1749  PROT 6.4*  ALBUMIN 3.1*  AST 28  ALT 21  ALKPHOS 70  BILITOT 1.0   Hematology Recent Labs  Lab 04/29/20 0810 04/29/20 1105 04/30/20 0538  WBC 9.5 8.8 8.5  RBC 4.62 4.21 4.43  HGB 13.7 12.5 12.8  HCT 44.0 39.0 41.3  MCV 95.2 92.6 93.2  MCH 29.7 29.7 28.9  MCHC 31.1 32.1 31.0  RDW 15.5 15.1 15.0  PLT 212 220 247   BNPNo results for input(s): BNP, PROBNP in the last 168 hours.  DDimer No results for input(s): DDIMER in the last 168 hours.   Radiology/Studies:  DG Chest 1 View  Result Date: 04/28/2020 CLINICAL DATA:  AFib EXAM: CHEST  1 VIEW COMPARISON:  April 27, 2020 FINDINGS: The heart size is stable. There is a trace right-sided pleural effusion. There  is a persistent airspace opacity at the left lung base. The heart size is borderline  enlarged. Aortic calcifications are noted. There is no pneumothorax. There is mild vascular congestion without overt pulmonary edema. IMPRESSION: Trace right-sided pleural effusion and bibasilar atelectasis. Electronically Signed   By: Constance Holster M.D.   On: 04/28/2020 23:30   CT ABDOMEN PELVIS W CONTRAST  Result Date: 04/28/2020 CLINICAL DATA:  86 year old female with concern for acute diverticulitis. EXAM: CT ABDOMEN AND PELVIS WITH CONTRAST TECHNIQUE: Multidetector CT imaging of the abdomen and pelvis was performed using the standard protocol following bolus administration of intravenous contrast. CONTRAST:  171mL OMNIPAQUE IOHEXOL 300 MG/ML  SOLN COMPARISON:  CT abdomen pelvis dated 11/06/2013. FINDINGS: Lower chest: There are bibasilar dependent atelectasis. There is a cluster of density in the left lower lobe which may represent atelectasis or infiltrate. Clinical correlation and follow-up to resolution recommended. No intra-abdominal free air or free fluid. Hepatobiliary: No focal liver abnormality is seen. No gallstones, gallbladder wall thickening, or biliary dilatation. Pancreas: Unremarkable. No pancreatic ductal dilatation or surrounding inflammatory changes. Spleen: Normal in size without focal abnormality. Adrenals/Urinary Tract: The adrenal glands unremarkable. There is no hydronephrosis on either side. There is symmetric enhancement and excretion of contrast by both kidneys. The visualized ureters and urinary bladder appear unremarkable. Stomach/Bowel: There is large stool in the rectal vault. There is inflammatory changes of the perirectal fat concerning for stercoral colitis. Clinical correlation is recommended. There is no bowel obstruction. The appendix is normal. Vascular/Lymphatic: Advanced aortoiliac atherosclerotic disease. The IVC is unremarkable. No portal venous gas. There is no adenopathy.  Reproductive: There is a 1 cm anterior fundal calcified fibroid. The ovaries are unremarkable. Other: Stranding and inflammatory changes of the presacral fat likely related to inflammatory/infectious process involving the rectal vault. No drainable fluid collection. Musculoskeletal: Osteopenia with extensive degenerative changes of the spine. Bilateral L5 pars defects with grade 1 L5-S1 anterolisthesis. Old-appearing compression fracture of the superior endplate of L1 with approximately 50% loss of vertebral body height. No acute osseous pathology. IMPRESSION: Large stool in the rectal vault with findings concerning for stercoral colitis. Clinical correlation is recommended. No bowel obstruction. Normal appendix. Electronically Signed   By: Anner Crete M.D.   On: 04/28/2020 19:05   ECHOCARDIOGRAM COMPLETE  Result Date: 04/29/2020    ECHOCARDIOGRAM REPORT   Patient Name:   Latoya Long Date of Exam: 04/29/2020 Medical Rec #:  [id number]      Height:       65.0 in Accession #:    [id number]     Weight:       160.0 lb Date of Birth:  24-Feb-1928      BSA:          1.799 m Patient Age:    53 years       BP:           119/32 mmHg Patient Gender: F              HR:           86 bpm. Exam Location:  [facility] Procedure: 2D Echo Indications:    Atrial Fibrillation 427.31 / I48.91  History:        Patient has no prior history of Echocardiogram examinations.                 Arrythmias:Atrial Fibrillation; Risk Factors:Hypertension and                 Non-Smoker.  Sonographer:    Leavy Cella RDCS (AE) Referring Phys: [place]  1. Left ventricular ejection fraction, by estimation, is 55 to 60%. The left ventricle has normal function. The left ventricle has no regional wall motion abnormalities. There is mild left ventricular hypertrophy. Left ventricular diastolic parameters are indeterminate.  2. Right ventricular systolic function is normal. The right ventricular size is normal.  Tricuspid regurgitation signal is inadequate for assessing PA pressure.  3. The mitral valve is grossly normal. Trivial mitral valve regurgitation.  4. The aortic valve is tricuspid. Aortic valve regurgitation is mild. Aortic regurgitation PHT measures 574 msec.  5. The inferior vena cava is dilated in size with <50% respiratory variability, suggesting right atrial pressure of 15 mmHg.  6. Telemetry not recorded during study. FINDINGS  Left Ventricle: Left ventricular ejection fraction, by estimation, is 55 to 60%. The left ventricle has normal function. The left ventricle has no regional wall motion abnormalities. The left ventricular internal cavity size was normal in size. There is  mild left ventricular hypertrophy. Left ventricular diastolic parameters are indeterminate. Right Ventricle: The right ventricular size is normal. No increase in right ventricular wall thickness. Right ventricular systolic function is normal. Tricuspid regurgitation signal is inadequate for assessing PA pressure. Left Atrium: Left atrial size was normal in size. Right Atrium: Right atrial size was normal in size. Pericardium: There is no evidence of pericardial effusion. Mitral Valve: The mitral valve is grossly normal. Mild mitral annular calcification. Trivial mitral valve regurgitation. Tricuspid Valve: The tricuspid valve is grossly normal. Tricuspid valve regurgitation is trivial. Aortic Valve: The aortic valve is tricuspid. There is mild aortic valve annular calcification. Aortic valve regurgitation is mild. Aortic regurgitation PHT measures 574 msec. Pulmonic Valve: The pulmonic valve was grossly normal. Pulmonic valve regurgitation is trivial. Aorta: The aortic root is normal in size and structure. Venous: The inferior vena cava is dilated in size with less than 50% respiratory variability, suggesting right atrial pressure of 15 mmHg. IAS/Shunts: No atrial level shunt detected by color flow Doppler.  LEFT VENTRICLE PLAX 2D  LVIDd:         4.04 cm  Diastology LVIDs:         2.96 cm  LV e' medial:    4.78 cm/s LV PW:         1.18 cm  LV E/e' medial:  16.8 LV IVS:        1.19 cm  LV e' lateral:   7.14 cm/s LVOT diam:     2.10 cm  LV E/e' lateral: 11.2 LVOT Area:     3.46 cm  RIGHT VENTRICLE RV S prime:     16.00 cm/s TAPSE (M-mode): 2.6 cm LEFT ATRIUM             Index       RIGHT ATRIUM           Index LA diam:        3.50 cm 1.95 cm/m  RA Area:     14.70 cm LA Vol (A2C):   53.8 ml 29.90 ml/m RA Volume:   45.10 ml  25.07 ml/m LA Vol (A4C):   55.7 ml 30.96 ml/m LA Biplane Vol: 56.4 ml 31.35 ml/m  AORTIC VALVE AI PHT:      574 msec  AORTA Ao Root diam: 2.80 cm MITRAL VALVE MV Area (PHT): 2.99 cm    SHUNTS MV Decel Time: 254 msec    Systemic Diam: 2.10 cm MV E velocity: 80.10 cm/s MV A velocity: 87.80 cm/s MV E/A ratio:  0.91 Rozann Lesches MD  Electronically signed by Rozann Lesches MD Signature Date/Time: 04/29/2020/11:39:37 AM    Final      Assessment and Plan:   1. Stercoral colitis with constipation/impaction and rectal bleeding - seen by GI - positive C Diff toxin at [facility] but not felt to represent active infection - rectal bleeding felt multifactorial in setting of stercoral colitis and likely ulceration +/- internal hemorrhoids - conservative measures and bowel regimen recommended by GI for now given lack of significant bleeding and stable blood counts  2. Newly recognized atrial fibrillation/flutter with RVR - telemetry shows NSR with paroxysms of atrial fib and atrial flutter with run of a faster tachycardia initially felt to be NSVT although she does not change axis so may represent SVT (this does not match the morphology of her rare PVC) - currently NSR at time of evaluation - patient was initially refusing oral meds due to some confusion but now amenable - continue diltiazem drip while acute illness improves - can consider addition of amiodarone as temporizing measure since atrial fib is paroxysmal,  although need to watch out so as not to worsen GI sx (had nausea prior to admission) - CHADSVASC 5 as outlined below, not currently on anticoagulation due to rectal bleeding this admission - will review with MD   3. Essential HTN - stable to low-normal BP at times  4. Elevated troponin - likely demand ischemia from metabolic/physiologic stressors - 2D Echo shows normal LVEF without WMA - in the setting of advanced age, lack of angina, normal LV function, and GI bleeding, would recommend conservative management   5. Mild aortic insufficiency - of no hemodynamic significance at this time  6. Hypoxia after procedure as above - CXR with trace right pleural effusion and bibasilar atelectasis - CT with "cluster of density in the left lower lobe which may represent atelectasis or infiltrate. Clinical correlation and follow-up to resolution recommended" - last O2 sats stable 95-98%            CHA2DS2-VASc Score = 5  This indicates a 7.2% annual risk of stroke. The patient's score is based upon: CHF History: 0 HTN History: 1 Diabetes History: 0 Stroke History: 0 Vascular Disease History: 1 (aortic plaque noted on 11/13 [facility] imaging) Age Score: 2 Gender Score: 1      For questions or updates, please contact Emily Please consult [email or web address] for contact info under    Signed, Charlie Pitter, PA-C  04/30/2020 8:44 AM  Patient seen and examined  I agree with findings as noted by D Dunn above   Pt resting     On tele:   SR and Afib (rates 80s to 110s) occasional PVCs and bursts of PAT  Lungs are relatively clear Cardiac exam   Irreg irreg   NO S3   No signif murmurs Abd   Decreased BS   Deferred Ext are without edema   Warm  Echo as noted above   LVEf and RVEF normal  I would keep on rate control for PAF  Contine IV diltiazem for now    I would not use amiodarone at present   Her CHADS VASc is 5 but not on anticoagulation due to rectal bleeding Need to reassess  often  Dorris Carnes MD

## 2020-04-30 NOTE — Progress Notes (Signed)
Drip restarted due to continued heart rate in the 120-140s. See eMAR for progression/titration. Notified mid-level due to patient refusal to take PO cardiac med and for runs of v-tach While speaking with mid-level. patient converted to sinus rhythm in the 80s  Will notify again if heart rate does not maintain current status/rate.

## 2020-04-30 NOTE — Plan of Care (Signed)
°  Problem: Acute Rehab PT Goals(only PT should resolve) Goal: Pt Will Go Supine/Side To Sit Outcome: Progressing Flowsheets (Taken 04/30/2020 1350) Pt will go Supine/Side to Sit:  with minimal assist  with moderate assist Goal: Patient Will Transfer Sit To/From Stand Outcome: Progressing Flowsheets (Taken 04/30/2020 1350) Patient will transfer sit to/from stand:  with minimal assist  with moderate assist Goal: Pt Will Transfer Bed To Chair/Chair To Bed Outcome: Progressing Flowsheets (Taken 04/30/2020 1350) Pt will Transfer Bed to Chair/Chair to Bed:  with min assist  with mod assist Goal: Pt Will Ambulate Outcome: Progressing Flowsheets (Taken 04/30/2020 1350) Pt will Ambulate:  25 feet  with minimal assist  with moderate assist  with rolling walker   1:50 PM, 04/30/20 Lonell Grandchild, MPT Physical Therapist with [facility] [phone] office [phone] mobile phone

## 2020-04-30 NOTE — Progress Notes (Signed)
Subjective: Feels "ok".  Was unaware of having any BM yesterday or today.  Discussed with nursing staff reports 1 small BM yesterday and one small BM this morning with very little amount of rectal bleeding.  Tolerating her diet well. No nausea or vomiting.  No abdominal pain. Mild rectal pressure earlier this morning, but none currently. Denies rectal pain.  Daughter at bedside.    Objective: Vital signs in last 24 hours: Temp:  [98 F (36.7 C)-98.9 F (37.2 C)] 98.4 F (36.9 C) (11/16 1145) Pulse Rate:  [26-135] 88 (11/16 1145) Resp:  [17-28] 20 (11/16 0810) BP: (95-159)/(18-97) 128/65 (11/16 0800) SpO2:  [91 %-98 %] 94 % (11/16 1145) FiO2 (%):  [28 %] 28 % (11/16 0000) Weight:  [72.4 kg] 72.4 kg (11/16 0500) Last BM Date: 04/30/20 General:   Alert and oriented, pleasant Head:  Normocephalic and atraumatic.  Abdomen:  Bowel sounds present, soft, non-tender, non-distended. No HSM or hernias noted. No rebound or guarding. No masses appreciated. Rectal: Erythematous and raw skin around rectum, no thrombosed external hemorrhoids. Difficult to see due to slow oozing of blood from rectum. Internal exam with soft stool in rectal vault and removed moderate amount with some discomfort. Steady slow oozing of red/burgundy stool and blood after manipulation for several minutes.  Extremities:  Without edema. Neurologic:  Alert and  oriented x3 Psych:  Normal mood and affect.  Intake/Output from previous day: 11/15 0701 - 11/16 0700 In: 2744.6 [P.O.:480; I.V.:2164.6; IV Piggyback:100] Out: 925 [Urine:925] Intake/Output this shift: Total I/O In: 30.3 [I.V.:30.3] Out: -   Lab Results: Recent Labs    04/29/20 0810 04/29/20 0810 04/29/20 1105 04/29/20 1105 04/30/20 0538 04/30/20 1006 04/30/20 1334  WBC 9.5  --  8.8  --  8.5  --   --   HGB 13.7   < > 12.5   < > 12.8 13.0 14.1  HCT 44.0   < > 39.0   < > 41.3 41.7 44.4  PLT 212  --  220  --  247  --   --    < > = values in this  interval not displayed.   BMET Recent Labs    04/28/20 1749 04/29/20 0810 04/30/20 0538  NA 135 135 133*  K 4.2 4.1 4.4  CL 103 103 99  CO2 23 21* 25  GLUCOSE 126* 90 104*  BUN 24* 20 15  CREATININE 0.93 0.76 0.78  CALCIUM 8.1* 8.1* 7.9*   LFT Recent Labs    04/28/20 1749  PROT 6.4*  ALBUMIN 3.1*  AST 28  ALT 21  ALKPHOS 70  BILITOT 1.0    Studies/Results: DG Chest 1 View  Result Date: 04/28/2020 CLINICAL DATA:  AFib EXAM: CHEST  1 VIEW COMPARISON:  April 27, 2020 FINDINGS: The heart size is stable. There is a trace right-sided pleural effusion. There is a persistent airspace opacity at the left lung base. The heart size is borderline enlarged. Aortic calcifications are noted. There is no pneumothorax. There is mild vascular congestion without overt pulmonary edema. IMPRESSION: Trace right-sided pleural effusion and bibasilar atelectasis. Electronically Signed   By: Constance Holster M.D.   On: 04/28/2020 23:30   CT ABDOMEN PELVIS W CONTRAST  Result Date: 04/28/2020 CLINICAL DATA:  86 year old female with concern for acute diverticulitis. EXAM: CT ABDOMEN AND PELVIS WITH CONTRAST TECHNIQUE: Multidetector CT imaging of the abdomen and pelvis was performed using the standard protocol following bolus administration of intravenous contrast. CONTRAST:  155mL OMNIPAQUE IOHEXOL  300 MG/ML  SOLN COMPARISON:  CT abdomen pelvis dated 11/06/2013. FINDINGS: Lower chest: There are bibasilar dependent atelectasis. There is a cluster of density in the left lower lobe which may represent atelectasis or infiltrate. Clinical correlation and follow-up to resolution recommended. No intra-abdominal free air or free fluid. Hepatobiliary: No focal liver abnormality is seen. No gallstones, gallbladder wall thickening, or biliary dilatation. Pancreas: Unremarkable. No pancreatic ductal dilatation or surrounding inflammatory changes. Spleen: Normal in size without focal abnormality. Adrenals/Urinary  Tract: The adrenal glands unremarkable. There is no hydronephrosis on either side. There is symmetric enhancement and excretion of contrast by both kidneys. The visualized ureters and urinary bladder appear unremarkable. Stomach/Bowel: There is large stool in the rectal vault. There is inflammatory changes of the perirectal fat concerning for stercoral colitis. Clinical correlation is recommended. There is no bowel obstruction. The appendix is normal. Vascular/Lymphatic: Advanced aortoiliac atherosclerotic disease. The IVC is unremarkable. No portal venous gas. There is no adenopathy. Reproductive: There is a 1 cm anterior fundal calcified fibroid. The ovaries are unremarkable. Other: Stranding and inflammatory changes of the presacral fat likely related to inflammatory/infectious process involving the rectal vault. No drainable fluid collection. Musculoskeletal: Osteopenia with extensive degenerative changes of the spine. Bilateral L5 pars defects with grade 1 L5-S1 anterolisthesis. Old-appearing compression fracture of the superior endplate of L1 with approximately 50% loss of vertebral body height. No acute osseous pathology. IMPRESSION: Large stool in the rectal vault with findings concerning for stercoral colitis. Clinical correlation is recommended. No bowel obstruction. Normal appendix. Electronically Signed   By: Anner Crete M.D.   On: 04/28/2020 19:05   ECHOCARDIOGRAM COMPLETE  Result Date: 04/29/2020    ECHOCARDIOGRAM REPORT   Patient Name:   CONNA TERADA Date of Exam: 04/29/2020 Medical Rec #:  [id number]      Height:       65.0 in Accession #:    [id number]     Weight:       160.0 lb Date of Birth:  06/29/1927      BSA:          1.799 m Patient Age:    86 years       BP:           119/32 mmHg Patient Gender: F              HR:           86 bpm. Exam Location:  [facility] Procedure: 2D Echo Indications:    Atrial Fibrillation 427.31 / I48.91  History:        Patient has no prior history of  Echocardiogram examinations.                 Arrythmias:Atrial Fibrillation; Risk Factors:Hypertension and                 Non-Smoker.  Sonographer:    Leavy Cella RDCS (AE) Referring Phys: [place]  1. Left ventricular ejection fraction, by estimation, is 55 to 60%. The left ventricle has normal function. The left ventricle has no regional wall motion abnormalities. There is mild left ventricular hypertrophy. Left ventricular diastolic parameters are indeterminate.  2. Right ventricular systolic function is normal. The right ventricular size is normal. Tricuspid regurgitation signal is inadequate for assessing PA pressure.  3. The mitral valve is grossly normal. Trivial mitral valve regurgitation.  4. The aortic valve is tricuspid. Aortic valve regurgitation is mild. Aortic regurgitation PHT measures 574 msec.  5. The inferior vena cava is dilated in size with <50% respiratory variability, suggesting right atrial pressure of 15 mmHg.  6. Telemetry not recorded during study. FINDINGS  Left Ventricle: Left ventricular ejection fraction, by estimation, is 55 to 60%. The left ventricle has normal function. The left ventricle has no regional wall motion abnormalities. The left ventricular internal cavity size was normal in size. There is  mild left ventricular hypertrophy. Left ventricular diastolic parameters are indeterminate. Right Ventricle: The right ventricular size is normal. No increase in right ventricular wall thickness. Right ventricular systolic function is normal. Tricuspid regurgitation signal is inadequate for assessing PA pressure. Left Atrium: Left atrial size was normal in size. Right Atrium: Right atrial size was normal in size. Pericardium: There is no evidence of pericardial effusion. Mitral Valve: The mitral valve is grossly normal. Mild mitral annular calcification. Trivial mitral valve regurgitation. Tricuspid Valve: The tricuspid valve is grossly normal.  Tricuspid valve regurgitation is trivial. Aortic Valve: The aortic valve is tricuspid. There is mild aortic valve annular calcification. Aortic valve regurgitation is mild. Aortic regurgitation PHT measures 574 msec. Pulmonic Valve: The pulmonic valve was grossly normal. Pulmonic valve regurgitation is trivial. Aorta: The aortic root is normal in size and structure. Venous: The inferior vena cava is dilated in size with less than 50% respiratory variability, suggesting right atrial pressure of 15 mmHg. IAS/Shunts: No atrial level shunt detected by color flow Doppler.  LEFT VENTRICLE PLAX 2D LVIDd:         4.04 cm  Diastology LVIDs:         2.96 cm  LV e' medial:    4.78 cm/s LV PW:         1.18 cm  LV E/e' medial:  16.8 LV IVS:        1.19 cm  LV e' lateral:   7.14 cm/s LVOT diam:     2.10 cm  LV E/e' lateral: 11.2 LVOT Area:     3.46 cm  RIGHT VENTRICLE RV S prime:     16.00 cm/s TAPSE (M-mode): 2.6 cm LEFT ATRIUM             Index       RIGHT ATRIUM           Index LA diam:        3.50 cm 1.95 cm/m  RA Area:     14.70 cm LA Vol (A2C):   53.8 ml 29.90 ml/m RA Volume:   45.10 ml  25.07 ml/m LA Vol (A4C):   55.7 ml 30.96 ml/m LA Biplane Vol: 56.4 ml 31.35 ml/m  AORTIC VALVE AI PHT:      574 msec  AORTA Ao Root diam: 2.80 cm MITRAL VALVE MV Area (PHT): 2.99 cm    SHUNTS MV Decel Time: 254 msec    Systemic Diam: 2.10 cm MV E velocity: 80.10 cm/s MV A velocity: 87.80 cm/s MV E/A ratio:  0.91 Rozann Lesches MD Electronically signed by Rozann Lesches MD Signature Date/Time: 04/29/2020/11:39:37 AM    Final     Assessment: Pleasant 86 year old female with reports of watery diarrhea and rectal bleeding, fatigue, weakness, presenting to the ED 11/14 with findings on CT of large stool in rectal vault and concerning for stercoral colitis. Diarrhea appeared to be more of an overflow incontinence. She was previously seen at [facility] on 11/13 with positive C. difficile toxin; however, this was reviewed with ID who  felt patient was likely a carrier with no need to treat.  Had attempted C. difficile testing while here, but not successful in light of formed stool .She has undergone manual disimpaction in the emergency room, once yesterday, and again once today each time with formed stool removed and oozing of red/burgundy colored stool and blood for several minutes following manipulation. She has had mild rectal discomfort/pressure, but no significant rectal pain. No abdominal pain. Hemoglobin has remained stable despite persistent rectal bleeding. Hemoglobin today 12.8> 13.0> 14.1.  Rectal bleeding multifactorial in setting of stercoral colitis and likely ulceration +/- internal hemorrhoids. Oozing is notable with any rectal manipulation. Last colonoscopy over 10 years ago in [place].   Discussed with Dr. Jenetta Downer. Plans to give her a bowel prep for therapeutic purposes to get her bowels moving well. If ongoing bleeding tomorrow, will need to consider colonoscopy for evaluation. Of note, empiric antibiotics had been started but can be discontinued.   A. Fib with RVR: Acquired during hospitalization.  Suspected to be secondary to acute stressors.  She has responded well to diltiazem drip and cardiology is following.   Plan: Nulytely today Clear liquid diet today Follow H/H q6 hours Monitor for ongoing rectal bleeding Possible colonoscopy tomorrow if she has ongoing significant rectal bleeding.  Will need to reassessed in the morning  Will make NPO at midnight for now pending reassessment in the morning.    LOS: 2 days    04/30/2020, 2:42 PM   Aliene Altes, [facility]

## 2020-05-01 DIAGNOSIS — Z7189 Other specified counseling: Secondary | ICD-10-CM | POA: Diagnosis not present

## 2020-05-01 DIAGNOSIS — K625 Hemorrhage of anus and rectum: Secondary | ICD-10-CM

## 2020-05-01 DIAGNOSIS — Z515 Encounter for palliative care: Secondary | ICD-10-CM | POA: Diagnosis not present

## 2020-05-01 DIAGNOSIS — K5289 Other specified noninfective gastroenteritis and colitis: Secondary | ICD-10-CM

## 2020-05-01 DIAGNOSIS — I4891 Unspecified atrial fibrillation: Secondary | ICD-10-CM

## 2020-05-01 DIAGNOSIS — R159 Full incontinence of feces: Secondary | ICD-10-CM

## 2020-05-01 LAB — CBC
HCT: 42.4 % (ref 36.0–46.0)
Hemoglobin: 13.5 g/dL (ref 12.0–15.0)
MCH: 29.5 pg (ref 26.0–34.0)
MCHC: 31.8 g/dL (ref 30.0–36.0)
MCV: 92.8 fL (ref 80.0–100.0)
Platelets: 253 10*3/uL (ref 150–400)
RBC: 4.57 MIL/uL (ref 3.87–5.11)
RDW: 14.9 % (ref 11.5–15.5)
WBC: 7.4 10*3/uL (ref 4.0–10.5)
nRBC: 0 % (ref 0.0–0.2)

## 2020-05-01 LAB — BASIC METABOLIC PANEL
Anion gap: 12 (ref 5–15)
BUN: 11 mg/dL (ref 8–23)
CO2: 22 mmol/L (ref 22–32)
Calcium: 8 mg/dL — ABNORMAL LOW (ref 8.9–10.3)
Chloride: 101 mmol/L (ref 98–111)
Creatinine, Ser: 0.68 mg/dL (ref 0.44–1.00)
GFR, Estimated: >60 mL/min (ref >60)
Glucose, Bld: 101 mg/dL — ABNORMAL HIGH (ref 70–99)
Potassium: 4.2 mmol/L (ref 3.5–5.1)
Sodium: 135 mmol/L (ref 135–145)

## 2020-05-01 LAB — HEMOGLOBIN AND HEMATOCRIT, BLOOD
HCT: 42.3 % (ref 36.0–46.0)
Hemoglobin: 13.3 g/dL (ref 12.0–15.0)

## 2020-05-01 LAB — MAGNESIUM: Magnesium: 2.1 mg/dL (ref 1.7–2.4)

## 2020-05-01 NOTE — Progress Notes (Signed)
Progress Note  Patient Name: Latoya Long Date of Encounter: 05/01/2020  Primary Cardiologist: Dorris Carnes, MD  Subjective   Feels better today in general.  No abdominal pain.  Loose stools have improved.  No sense of palpitations or shortness of breath despite recurrent atrial fibrillation.  Inpatient Medications    Scheduled Meds: . Chlorhexidine Gluconate Cloth  6 each Topical Daily  . docusate sodium  100 mg Oral BID  . feeding supplement  1 Container Oral TID BM   Continuous Infusions: . diltiazem (CARDIZEM) infusion 7.5 mg/hr (05/01/20 0700)  . lactated ringers     PRN Meds: acetaminophen **OR** acetaminophen, labetalol, ondansetron **OR** ondansetron (ZOFRAN) IV, sodium phosphate   Vital Signs    Vitals:   05/01/20 0500 05/01/20 0600 05/01/20 0700 05/01/20 0800  BP: (!) 154/71 (!) 145/76 (!) 173/67 (!) 177/65  Pulse: 90 (!) 120 87 85  Resp: (!) 22 (!) 27 (!) 23 20  Temp:      TempSrc:      SpO2: 93% 92% 91% 94%  Weight:      Height:        Intake/Output Summary (Last 24 hours) at 05/01/2020 0905 Last data filed at 05/01/2020 0700 Gross per 24 hour  Intake 150.52 ml  Output 1425 ml  Net -1274.48 ml   Filed Weights   04/28/20 1544 04/30/20 0500  Weight: 72.6 kg 72.4 kg    Telemetry    Atrial fibrillation with RVR.  Personally reviewed.  ECG    An ECG dated 04/30/2020 was personally reviewed today and demonstrated:  Atrial fibrillation with IVCD and increased voltage, leftward axis, nonspecific ST changes.  Physical Exam   GEN:  Elderly woman.  No acute distress.   Neck: No JVD. Cardiac:  Irregularly irregular, no gallop.  Respiratory: Nonlabored. Clear to auscultation bilaterally. GI: Soft, nontender, bowel sounds present. MS: No edema; No deformity. Neuro:  Nonfocal. Psych: Alert and oriented x 3. Normal affect.  Labs    Chemistry Recent Labs  Lab 04/28/20 1749 04/28/20 1749 04/29/20 0810 04/30/20 0538 05/01/20 0526  NA 135    < > 135 133* 135  K 4.2   < > 4.1 4.4 4.2  CL 103   < > 103 99 101  CO2 23   < > 21* 25 22  GLUCOSE 126*   < > 90 104* 101*  BUN 24*   < > 20 15 11   CREATININE 0.93   < > 0.76 0.78 0.68  CALCIUM 8.1*   < > 8.1* 7.9* 8.0*  PROT 6.4*  --   --   --   --   ALBUMIN 3.1*  --   --   --   --   AST 28  --   --   --   --   ALT 21  --   --   --   --   ALKPHOS 70  --   --   --   --   BILITOT 1.0  --   --   --   --   GFRNONAA 58*   < > >60 >60 >60  ANIONGAP 9   < > 11 9 12    < > = values in this interval not displayed.     Hematology Recent Labs  Lab 04/29/20 1105 04/29/20 1105 04/30/20 0538 04/30/20 1006 04/30/20 1746 04/30/20 2213 05/01/20 0526  WBC 8.8  --  8.5  --   --   --  7.4  RBC 4.21  --  4.43  --   --   --  4.57  HGB 12.5   < > 12.8   < > 13.3 12.4 13.5  HCT 39.0   < > 41.3   < > 41.9 39.8 42.4  MCV 92.6  --  93.2  --   --   --  92.8  MCH 29.7  --  28.9  --   --   --  29.5  MCHC 32.1  --  31.0  --   --   --  31.8  RDW 15.1  --  15.0  --   --   --  14.9  PLT 220  --  247  --   --   --  253   < > = values in this interval not displayed.    Cardiac Enzymes Recent Labs  Lab 04/28/20 2325 04/29/20 0316  TROPONINIHS 272* 267*    Radiology    ECHOCARDIOGRAM COMPLETE  Result Date: 04/29/2020    ECHOCARDIOGRAM REPORT   Patient Name:   Latoya Long Date of Exam: 04/29/2020 Medical Rec #:  [id number]      Height:       65.0 in Accession #:    [id number]     Weight:       160.0 lb Date of Birth:  August 10, 1927      BSA:          1.799 m Patient Age:    86 years       BP:           119/32 mmHg Patient Gender: F              HR:           86 bpm. Exam Location:  [facility] Procedure: 2D Echo Indications:    Atrial Fibrillation 427.31 / I48.91  History:        Patient has no prior history of Echocardiogram examinations.                 Arrythmias:Atrial Fibrillation; Risk Factors:Hypertension and                 Non-Smoker.  Sonographer:    Leavy Cella RDCS (AE) Referring Phys:  [place]  1. Left ventricular ejection fraction, by estimation, is 55 to 60%. The left ventricle has normal function. The left ventricle has no regional wall motion abnormalities. There is mild left ventricular hypertrophy. Left ventricular diastolic parameters are indeterminate.  2. Right ventricular systolic function is normal. The right ventricular size is normal. Tricuspid regurgitation signal is inadequate for assessing PA pressure.  3. The mitral valve is grossly normal. Trivial mitral valve regurgitation.  4. The aortic valve is tricuspid. Aortic valve regurgitation is mild. Aortic regurgitation PHT measures 574 msec.  5. The inferior vena cava is dilated in size with <50% respiratory variability, suggesting right atrial pressure of 15 mmHg.  6. Telemetry not recorded during study. FINDINGS  Left Ventricle: Left ventricular ejection fraction, by estimation, is 55 to 60%. The left ventricle has normal function. The left ventricle has no regional wall motion abnormalities. The left ventricular internal cavity size was normal in size. There is  mild left ventricular hypertrophy. Left ventricular diastolic parameters are indeterminate. Right Ventricle: The right ventricular size is normal. No increase in right ventricular wall thickness. Right ventricular systolic function is normal. Tricuspid regurgitation signal is inadequate for assessing PA pressure. Left Atrium: Left atrial  size was normal in size. Right Atrium: Right atrial size was normal in size. Pericardium: There is no evidence of pericardial effusion. Mitral Valve: The mitral valve is grossly normal. Mild mitral annular calcification. Trivial mitral valve regurgitation. Tricuspid Valve: The tricuspid valve is grossly normal. Tricuspid valve regurgitation is trivial. Aortic Valve: The aortic valve is tricuspid. There is mild aortic valve annular calcification. Aortic valve regurgitation is mild. Aortic regurgitation PHT  measures 574 msec. Pulmonic Valve: The pulmonic valve was grossly normal. Pulmonic valve regurgitation is trivial. Aorta: The aortic root is normal in size and structure. Venous: The inferior vena cava is dilated in size with less than 50% respiratory variability, suggesting right atrial pressure of 15 mmHg. IAS/Shunts: No atrial level shunt detected by color flow Doppler.  LEFT VENTRICLE PLAX 2D LVIDd:         4.04 cm  Diastology LVIDs:         2.96 cm  LV e' medial:    4.78 cm/s LV PW:         1.18 cm  LV E/e' medial:  16.8 LV IVS:        1.19 cm  LV e' lateral:   7.14 cm/s LVOT diam:     2.10 cm  LV E/e' lateral: 11.2 LVOT Area:     3.46 cm  RIGHT VENTRICLE RV S prime:     16.00 cm/s TAPSE (M-mode): 2.6 cm LEFT ATRIUM             Index       RIGHT ATRIUM           Index LA diam:        3.50 cm 1.95 cm/m  RA Area:     14.70 cm LA Vol (A2C):   53.8 ml 29.90 ml/m RA Volume:   45.10 ml  25.07 ml/m LA Vol (A4C):   55.7 ml 30.96 ml/m LA Biplane Vol: 56.4 ml 31.35 ml/m  AORTIC VALVE AI PHT:      574 msec  AORTA Ao Root diam: 2.80 cm MITRAL VALVE MV Area (PHT): 2.99 cm    SHUNTS MV Decel Time: 254 msec    Systemic Diam: 2.10 cm MV E velocity: 80.10 cm/s MV A velocity: 87.80 cm/s MV E/A ratio:  0.91 Rozann Lesches MD Electronically signed by Rozann Lesches MD Signature Date/Time: 04/29/2020/11:39:37 AM    Final     Patient Profile     86 y.o. female with a history of hypertension presenting with loose stools and rectal bleeding and newly documented paroxysmal atrial fibrillation.  Assessment & Plan    1.  Paroxysmal atrial fibrillation.  CHA2DS2-VASc score is 5.  She is back in atrial fibrillation this morning, continues on IV diltiazem, is not anticoagulated given recent rectal bleeding.  She is not aware of any sense of palpitations and is otherwise hemodynamically stable.  Left atrial size normal by echocardiogram.  2.  History of hypertension, not on antihypertensive therapy as an outpatient.   Recent systolic blood pressures 338S to 170s.  LVEF normal at 55 to 60% with mild LVH.  3.  Mild increase in high-sensitivity troponin I most consistent with demand ischemia.  Continue IV Cardizem for now, as oral intake improves will change to oral Cardizem (diet likely to advance today).  I am hopeful that she will spontaneously convert back to sinus rhythm as she has done already during her stay, if not may need to consider at least a temporary course of amiodarone.  Signed,  Rozann Lesches, MD  05/01/2020, 9:05 AM

## 2020-05-01 NOTE — TOC Initial Note (Signed)
Transition of Care [facility]) - Initial/Assessment Note    Patient Details  Name: Latoya Long MRN: [id number] Date of Birth: 08/18/1927  [facility] CM/SW Contact:    Latoya Gully, LCSW Phone Number: 05/01/2020, 4:47 PM  Clinical Narrative:                 Patient from home alone. Daughter, Latoya Long, spends every night with her. Patient uses a rollator and has a transport chair. Latoya Long was at bedside. PT evaluation discussed. SNF declined. Agreeable to [facility]. Choices discussed.    Expected Discharge Plan: [place] Barriers to Discharge: Continued Medical Work up   Patient Goals and CMS Choice        Expected Discharge Plan and Services Expected Discharge Plan: [place]       Living arrangements for the past 2 months: Single Family Home                                      Prior Living Arrangements/Services Living arrangements for the past 2 months: Single Family Home Lives with:: Self Patient language and need for interpreter reviewed:: Yes Do you feel safe going back to the place where you live?: Yes      Need for Family Participation in Patient Care: Yes (Comment) Care giver support system in place?: Yes (comment) Current home services: DME Criminal Activity/Legal Involvement Pertinent to Current Situation/Hospitalization: No - Comment as needed  Activities of Daily Living Home Assistive Devices/Equipment: Walker (specify type) ADL Screening (condition at time of admission) Patient's cognitive ability adequate to safely complete daily activities?: Yes Is the patient deaf or have difficulty hearing?: No Does the patient have difficulty seeing, even when wearing glasses/contacts?: No Does the patient have difficulty concentrating, remembering, or making decisions?: Yes Patient able to express need for assistance with ADLs?: Yes Does the patient have difficulty dressing or bathing?:  Yes Independently performs ADLs?: Yes (appropriate for developmental age) Does the patient have difficulty walking or climbing stairs?: Yes Weakness of Legs: None Weakness of Arms/Hands: None  Permission Sought/Granted Permission sought to share information with : Family Supports Permission granted to share information with : Yes, Release of Information Signed  Share Information with NAME: daugter, Latoya Long           Emotional Assessment     Affect (typically observed): Appropriate Orientation: : Oriented to Self, Oriented to Place, Oriented to  Time, Oriented to Situation Alcohol / Substance Use: Not Applicable Psych Involvement: No (comment)  Admission diagnosis:  Encopresis with constipation and overflow incontinence [R15.9] Hypoxia [R09.02] Atrial fibrillation with rapid ventricular response (HCC) [I48.91] Atrial fibrillation with RVR ([place]) [I48.91] Patient Active Problem List   Diagnosis Date Noted  . Goals of care, counseling/discussion   . Palliative care by specialist   . DNR (do not resuscitate) discussion   . Atrial fibrillation with rapid ventricular response ([place]) 04/28/2020  . 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 HTN (hypertension) 04/28/2020  . Rectal bleeding 04/28/2020   PCP:  Latoya Percy, MD Pharmacy:   [facility], [address] [place] [address] Phone: [phone] Fax: [phone]     Social Determinants of Health (SDOH) Interventions    Readmission Risk Interventions No flowsheet data found.

## 2020-05-01 NOTE — Progress Notes (Signed)
Palliative: Mrs. Emrich is resting quietly in bed.  She greets me making and mostly keeping eye contact.  She appears younger than stated age, good constitution.  She is alert and oriented, able to make her needs known.  Present today at bedside is daughter Maudie Mercury.  We talked about her GI function in detail.  She does share that she is somewhat better, but continues to complain of skin breakdown on her bottom.  We talked about the treatment plan, selected labs, stable hemoglobin.  We also talked about cardiology consult and plan.  We talked about time for outcomes.  Questions answered, no additional concerns at this time.  Conference with attending, bedside nursing staff, transition of care team related to patient condition, needs, goals of care.  Plan: Continue to treat the treatable but no CPR or intubation.  Would prefer home with home health, not short-term rehab.     25 minutes Quinn Axe, NP Palliative Medicine Team Team Phone # [phone] Greater than 50% of this time was spent counseling and coordinating care related to the above assessment and plan

## 2020-05-01 NOTE — Progress Notes (Signed)
Physical Therapy Treatment Patient Details Name: Latoya Long MRN: [id number] DOB: 03-01-1928 Today's Date: 05/01/2020    History of Present Illness Latoya Long is a 86 y.o. female with medical history significant for hypertension.  Patient has been having multiple continuous watery stools since yesterday.  Yesterday and today patient had small amounts of blood in stool.  No abdominal pain.  Patient was weak, today and unable to ambulate.  Patient has a history of chronic constipation.  Patient was seen at [facility] ER yesterday and diagnosed with UTI but not so not given any medications.  Daughter was concerned about stool C. difficile and brought patient to the ED here. Patient and daughter deny history of stroke, no cardiac history.  No history of irregular heart rhythm.  Denies history of frequent falls, daughter reports 2 falls over the past few years which are mechanical falls. Patient never smoked cigarettes, denies difficulty breathing or cough.  No chest pain.  No lower extremity swelling, no recent trips. Denies Dysuria or lower abdominal pain.    PT Comments    Patient demonstrates slow labored movement for sitting up at bedside, limited to a few unsteady slow labored steps at bedside before having to sit due to fatigue and poor standing balance.  Patient tolerated sitting up in chair after therapy with her daughter present in room - nursing staff aware.  Patient will benefit from continued physical therapy in hospital and recommended venue below to increase strength, balance, endurance for safe ADLs and gait.   Follow Up Recommendations  SNF     Equipment Recommendations  None recommended by PT    Recommendations for Other Services       Precautions / Restrictions Precautions Precautions: Fall Restrictions Weight Bearing Restrictions: No    Mobility  Bed Mobility Overal bed mobility: Needs Assistance Bed Mobility: Supine to Sit     Supine to sit: Mod assist      General bed mobility comments: increased time, labored movement  Transfers Overall transfer level: Needs assistance Equipment used: Rolling walker (2 wheeled) Transfers: Sit to/from [facility] Sit to Stand: Mod assist Stand pivot transfers: Mod assist       General transfer comment: increased time, labored movement  Ambulation/Gait Ambulation/Gait assistance: Mod assist;Max assist Gait Distance (Feet): 5 Feet Assistive device: Rolling walker (2 wheeled) Gait Pattern/deviations: Decreased step length - left;Decreased step length - right;Decreased stride length Gait velocity: decreased   General Gait Details: limited to 5-6 slow labored unsteady side steps at bedside due to BLE weakness, poor standing balance   Stairs             Wheelchair Mobility    Modified Rankin (Stroke Patients Only)       Balance Overall balance assessment: Needs assistance Sitting-balance support: Feet supported;No upper extremity supported Sitting balance-Leahy Scale: Fair Sitting balance - Comments: seated at EOB   Standing balance support: During functional activity;Bilateral upper extremity supported Standing balance-Leahy Scale: Poor Standing balance comment: fair/poor using RW                            Cognition Arousal/Alertness: Awake/alert Behavior During Therapy: WFL for tasks assessed/performed Overall Cognitive Status: Within Functional Limits for tasks assessed  Exercises General Exercises - Lower Extremity Long Arc Quad: Seated;AROM;Strengthening;Both;10 reps Hip Flexion/Marching: Seated;AROM;Strengthening;Both;10 reps Toe Raises: Seated;AROM;Strengthening;Both;10 reps Heel Raises: Seated;AROM;Strengthening;Both;10 reps    General Comments        Pertinent Vitals/Pain Pain Assessment: No/denies pain    Home Living                      Prior Function             PT Goals (current goals can now be found in the care plan section) Acute Rehab PT Goals Patient Stated Goal: return home with family to assist PT Goal Formulation: With patient Time For Goal Achievement: 05/14/20 Potential to Achieve Goals: Good Progress towards PT goals: Progressing toward goals    Frequency    Min 3X/week      PT Plan Current plan remains appropriate    Co-evaluation              AM-PAC PT "6 Clicks" Mobility   Outcome Measure  Help needed turning from your back to your side while in a flat bed without using bedrails?: A Lot Help needed moving from lying on your back to sitting on the side of a flat bed without using bedrails?: A Lot Help needed moving to and from a bed to a chair (including a wheelchair)?: A Lot Help needed standing up from a chair using your arms (e.g., wheelchair or bedside chair)?: A Lot Help needed to walk in hospital room?: A Lot Help needed climbing 3-5 steps with a railing? : Total 6 Click Score: 11    End of Session   Activity Tolerance: Patient tolerated treatment well;Patient limited by fatigue Patient left: in chair;with call bell/phone within reach;with family/visitor present Nurse Communication: Mobility status PT Visit Diagnosis: Unsteadiness on feet (R26.81);Other abnormalities of gait and mobility (R26.89);Muscle weakness (generalized) (M62.81)     Time: 2330-0762 PT Time Calculation (min) (ACUTE ONLY): 26 min  Charges:  $Therapeutic Exercise: 8-22 mins $Therapeutic Activity: 8-22 mins                     3:34 PM, 05/01/20 Lonell Grandchild, MPT Physical Therapist with [facility] [phone] office [phone] mobile phone

## 2020-05-01 NOTE — Progress Notes (Signed)
PROGRESS NOTE  Latoya Long TIW:[id number] DOB: [birth year] DOA: 04/28/2020 PCP: Rory Percy, MD  Brief History:  86 y.o.femalewith medical history significant forhypertension.Patient has been having multiple continuous watery stools since yesterday. Yesterday and today patient had small amounts ofblood in stool. No abdominal pain. Patient was weak, today and unable to ambulate. Patient has a history of chronic constipation.Patient was seen at [facility] ER yesterday and diagnosed with UTI but not so not given any medications. Daughter was concerned about stool C. difficile and brought patient to the ED here.  Patient and daughter deny history of stroke, no cardiac history. No history of irregular heart rhythm. Denies history of frequent falls, daughter reports 2 falls over the past few years which are mechanical falls.  Patient never smoked cigarettes, denies difficulty breathing or cough. No chest pain. No lower extremity swelling, no recent trips.  Denies Dysuria or lower abdominal pain.   ED Course:heart rateinitially in the 90s increased to 159.O2 sats initially greater than 92% on room air, after procedure dropped tomid 80s placed on 3 L nasal cannula.EKG showing atrial fibrillation with RVR. Magnesium 2.2.WBC 11.9. Large stool in the rectal vault concerning for stercoral colitis. Fecal disimpaction attempted in the EDlargelyunsuccessful,with small amount of blood in stool. Cardizem drip started. 500 mill bolus given. Hospitalist admit for atrial fibrillation with RVR, and stercoral colitis and rectal bleeding.  Assessment/Plan: Paroxysmal atrial fibrillation with RVR -CHA2DS2-VASc score is 5 - back in atrial fibrillation this morning -continue IV diltiazem -04/29/20 Echo--EF 55-60%, no WMA -not on AC due to GIB -TSH 1.143  Hematochezia/GI Bleed -appreciate GI consult/follow up -given bowel prep -05/01/20--had 3 BMs with hematochezia, but less  volume than 11/16 -no plans for colonoscopy if pt remains stable and Hgb remains stable -overall Hgb stable  Hypertension -continue IV diltiazem -not on any agents prior to hospitalization  Stercoral colitis -presenting with diarrhea likely postobstructive. Colitis likely from fecal impaction.  Fecal disimpaction attempted in the ED likely unsuccessful. -initially started IV ceftriaxone and metronidazole--stopped 11/15 -BMP, CBC in the morning  Acute respiratory Failure with hypoxia -due to atelectatic disease -initially 87% RA -placed on 2L -wean off oxygen -IS -personally reviewed CXR--no edema or infiltrates  Chronic constipation with fecal impaction -  -unsuccessful disimpaction in ED, appreciate GI recs for an appropriate bowel regimen   Goals of Care -palliative consulted -patient now DNR          Status is: Inpatient  Remains inpatient appropriate because:IV treatments appropriate due to intensity of illness or inability to take PO   Dispo: The patient is from: Home              Anticipated d/c is to: SNF              Anticipated d/c date is: 2 days              Patient currently is not medically stable to d/c.        Family Communication:   Daughter updated at bedside11/17  Consultants:  GI  Code Status:  DNR  DVT Prophylaxis:  SCDs   Procedures: As Listed in Progress Note Above  Antibiotics: Ceftriaxone 11/14>>11/15 azithro 11/14>>11/15     Subjective: Patient denies fevers, chills, headache, chest pain, dyspnea, nausea, vomiting, diarrhea, abdominal pain, dysuria, hematuria, and melena.  3 small volume bloody stools today.   Objective: Vitals:   05/01/20 1300 05/01/20 1330 05/01/20 1338 05/01/20 1400  BP: Marland Kitchen)  155/51 (!) 147/67  (!) 117/58  Pulse: 69 90 (!) 109 (!) 103  Resp: (!) 28 (!) 25 (!) 28 (!) 29  Temp:      TempSrc:      SpO2: 94% (!) 89% 95% 94%  Weight:      Height:        Intake/Output Summary (Last 24 hours)  at 05/01/2020 1507 Last data filed at 05/01/2020 0700 Gross per 24 hour  Intake 150.52 ml  Output 1425 ml  Net -1274.48 ml   Weight change:  Exam:   General:  Pt is alert, follows commands appropriately, not in acute distress  HEENT: No icterus, No thrush, No neck mass, [place]/AT  Cardiovascular: IRRR, S1/S2, no rubs, no gallops  Respiratory: bibasilar crackles. No wheeze  Abdomen: Soft/+BS, non tender, non distended, no guarding  Extremities: No edema, No lymphangitis, No petechiae, No rashes, no synovitis   Data Reviewed: I have personally reviewed following labs and imaging studies Basic Metabolic Panel: Recent Labs  Lab 04/28/20 1749 04/29/20 0810 04/30/20 0538 05/01/20 0526  NA 135 135 133* 135  K 4.2 4.1 4.4 4.2  CL 103 103 99 101  CO2 23 21* 25 22  GLUCOSE 126* 90 104* 101*  BUN 24* 20 15 11   CREATININE 0.93 0.76 0.78 0.68  CALCIUM 8.1* 8.1* 7.9* 8.0*  MG 2.2  --  2.1 2.1   Liver Function Tests: Recent Labs  Lab 04/28/20 1749  AST 28  ALT 21  ALKPHOS 70  BILITOT 1.0  PROT 6.4*  ALBUMIN 3.1*   Recent Labs  Lab 04/28/20 1749  LIPASE 19   No results for input(s): AMMONIA in the last 168 hours. Coagulation Profile: No results for input(s): INR, PROTIME in the last 168 hours. CBC: Recent Labs  Lab 04/28/20 1749 04/29/20 0316 04/29/20 0810 04/29/20 0810 04/29/20 1105 04/29/20 1105 04/30/20 0538 04/30/20 1006 04/30/20 1334 04/30/20 1746 04/30/20 2213 05/01/20 0526 05/01/20 0934  WBC 11.9*  --  9.5  --  8.8  --  8.5  --   --   --   --  7.4  --   NEUTROABS 10.0*  --   --   --  7.1  --   --   --   --   --   --   --   --   HGB 14.0   < > 13.7   < > 12.5   < > 12.8   < > 14.1 13.3 12.4 13.5 13.3  HCT 44.2   < > 44.0   < > 39.0   < > 41.3   < > 44.4 41.9 39.8 42.4 42.3  MCV 93.8  --  95.2  --  92.6  --  93.2  --   --   --   --  92.8  --   PLT 248  --  212  --  220  --  247  --   --   --   --  253  --    < > = values in this interval not  displayed.   Cardiac Enzymes: No results for input(s): CKTOTAL, CKMB, CKMBINDEX, TROPONINI in the last 168 hours. BNP: Invalid input(s): POCBNP CBG: Recent Labs  Lab 04/30/20 1147  GLUCAP 174*   HbA1C: No results for input(s): HGBA1C in the last 72 hours. Urine analysis:    Component Value Date/Time   COLORURINE YELLOW 11/06/2013 [place] 11/06/2013 1045   LABSPEC <1.005 (L) 11/06/2013  [place] 6.0 11/06/2013 1045   GLUCOSEU NEGATIVE 11/06/2013 1045   HGBUR TRACE (A) 11/06/2013 [place] 11/06/2013 [place] 11/06/2013 [place] 11/06/2013 1045   UROBILINOGEN 0.2 11/06/2013 1045   NITRITE NEGATIVE 11/06/2013 1045   LEUKOCYTESUR NEGATIVE 11/06/2013 1045   Sepsis Labs: @LABRCNTIP (procalcitonin:4,lacticidven:4) ) Recent Results (from the past 240 hour(s))  Respiratory Panel by RT PCR (Flu A&B, Covid) -     Status: None   Collection Time: 04/28/20  4:23 PM  Result Value Ref Range Status   SARS Coronavirus 2 by RT PCR NEGATIVE NEGATIVE Final    Comment: (NOTE) SARS-CoV-2 target nucleic acids are NOT DETECTED.  The SARS-CoV-2 RNA is generally detectable in upper respiratoy specimens during the acute phase of infection. The lowest concentration of SARS-CoV-2 viral copies this assay can detect is 131 copies/mL. A negative result does not preclude SARS-Cov-2 infection and should not be used as the sole basis for treatment or other patient management decisions. A negative result may occur with  improper specimen collection/handling, submission of specimen other than nasopharyngeal swab, presence of viral mutation(s) within the areas targeted by this assay, and inadequate number of viral copies (<131 copies/mL). A negative result must be combined with clinical observations, patient history, and epidemiological information. The expected result is Negative.  Fact Sheet for Patients:    PinkCheek.be  Fact Sheet for Healthcare Providers:  GravelBags.it  This test is no t yet approved or cleared by the [place] FDA and  has been authorized for detection and/or diagnosis of SARS-CoV-2 by FDA under an Emergency Use Authorization (EUA). This EUA will remain  in effect (meaning this test can be used) for the duration of the COVID-19 declaration under Section 564(b)(1) of the Act, 21 U.S.C. section 360bbb-3(b)(1), unless the authorization is terminated or revoked sooner.     Influenza A by PCR NEGATIVE NEGATIVE Final   Influenza B by PCR NEGATIVE NEGATIVE Final    Comment: (NOTE) The Xpert Xpress SARS-CoV-2/FLU/RSV assay is intended as an aid in  the diagnosis of influenza from Nasopharyngeal swab specimens and  should not be used as a sole basis for treatment. Nasal washings and  aspirates are unacceptable for Xpert Xpress SARS-CoV-2/FLU/RSV  testing.  Fact Sheet for Patients: PinkCheek.be  Fact Sheet for Healthcare Providers: GravelBags.it  This test is not yet approved or cleared by the [place] FDA and  has been authorized for detection and/or diagnosis of SARS-CoV-2 by  FDA under an Emergency Use Authorization (EUA). This EUA will remain  in effect (meaning this test can be used) for the duration of the  Covid-19 declaration under Section 564(b)(1) of the Act, 21  U.S.C. section 360bbb-3(b)(1), unless the authorization is  terminated or revoked. Performed at [facility], [address], [place], [place] [address]   MRSA PCR Screening     Status: None   Collection Time: 04/29/20  2:21 PM   Specimen: Nasal Mucosa; Nasopharyngeal  Result Value Ref Range Status   MRSA by PCR NEGATIVE NEGATIVE Final    Comment:        The GeneXpert MRSA Assay (FDA approved for NASAL specimens only), is one component of a comprehensive MRSA  colonization surveillance program. It is not intended to diagnose MRSA infection nor to guide or monitor treatment for MRSA infections. Performed at [facility], [address], [place], [place] [address]      Scheduled Meds: . Chlorhexidine Gluconate Cloth  6 each Topical Daily  . docusate sodium  100 mg Oral BID  . feeding supplement  1 Container Oral TID BM   Continuous Infusions: . diltiazem (CARDIZEM) infusion 7.5 mg/hr (05/01/20 0700)  . lactated ringers      Procedures/Studies: DG Chest 1 View  Result Date: 04/28/2020 CLINICAL DATA:  AFib EXAM: CHEST  1 VIEW COMPARISON:  April 27, 2020 FINDINGS: The heart size is stable. There is a trace right-sided pleural effusion. There is a persistent airspace opacity at the left lung base. The heart size is borderline enlarged. Aortic calcifications are noted. There is no pneumothorax. There is mild vascular congestion without overt pulmonary edema. IMPRESSION: Trace right-sided pleural effusion and bibasilar atelectasis. Electronically Signed   By: Constance Holster M.D.   On: 04/28/2020 23:30   CT ABDOMEN PELVIS W CONTRAST  Result Date: 04/28/2020 CLINICAL DATA:  86 year old female with concern for acute diverticulitis. EXAM: CT ABDOMEN AND PELVIS WITH CONTRAST TECHNIQUE: Multidetector CT imaging of the abdomen and pelvis was performed using the standard protocol following bolus administration of intravenous contrast. CONTRAST:  128mL OMNIPAQUE IOHEXOL 300 MG/ML  SOLN COMPARISON:  CT abdomen pelvis dated 11/06/2013. FINDINGS: Lower chest: There are bibasilar dependent atelectasis. There is a cluster of density in the left lower lobe which may represent atelectasis or infiltrate. Clinical correlation and follow-up to resolution recommended. No intra-abdominal free air or free fluid. Hepatobiliary: No focal liver abnormality is seen. No gallstones, gallbladder wall thickening, or biliary dilatation. Pancreas: Unremarkable. No pancreatic  ductal dilatation or surrounding inflammatory changes. Spleen: Normal in size without focal abnormality. Adrenals/Urinary Tract: The adrenal glands unremarkable. There is no hydronephrosis on either side. There is symmetric enhancement and excretion of contrast by both kidneys. The visualized ureters and urinary bladder appear unremarkable. Stomach/Bowel: There is large stool in the rectal vault. There is inflammatory changes of the perirectal fat concerning for stercoral colitis. Clinical correlation is recommended. There is no bowel obstruction. The appendix is normal. Vascular/Lymphatic: Advanced aortoiliac atherosclerotic disease. The IVC is unremarkable. No portal venous gas. There is no adenopathy. Reproductive: There is a 1 cm anterior fundal calcified fibroid. The ovaries are unremarkable. Other: Stranding and inflammatory changes of the presacral fat likely related to inflammatory/infectious process involving the rectal vault. No drainable fluid collection. Musculoskeletal: Osteopenia with extensive degenerative changes of the spine. Bilateral L5 pars defects with grade 1 L5-S1 anterolisthesis. Old-appearing compression fracture of the superior endplate of L1 with approximately 50% loss of vertebral body height. No acute osseous pathology. IMPRESSION: Large stool in the rectal vault with findings concerning for stercoral colitis. Clinical correlation is recommended. No bowel obstruction. Normal appendix. Electronically Signed   By: Anner Crete M.D.   On: 04/28/2020 19:05   ECHOCARDIOGRAM COMPLETE  Result Date: 04/29/2020    ECHOCARDIOGRAM REPORT   Patient Name:   ETTAMAE BARKETT Date of Exam: 04/29/2020 Medical Rec #:  [id number]      Height:       65.0 in Accession #:    [id number]     Weight:       160.0 lb Date of Birth:  02/23/1928      BSA:          1.799 m Patient Age:    61 years       BP:           119/32 mmHg Patient Gender: F              HR:  86 bpm. Exam Location:  [facility]  Procedure: 2D Echo Indications:    Atrial Fibrillation 427.31 / I48.91  History:        Patient has no prior history of Echocardiogram examinations.                 Arrythmias:Atrial Fibrillation; Risk Factors:Hypertension and                 Non-Smoker.  Sonographer:    Leavy Cella RDCS (AE) Referring Phys: [place]  1. Left ventricular ejection fraction, by estimation, is 55 to 60%. The left ventricle has normal function. The left ventricle has no regional wall motion abnormalities. There is mild left ventricular hypertrophy. Left ventricular diastolic parameters are indeterminate.  2. Right ventricular systolic function is normal. The right ventricular size is normal. Tricuspid regurgitation signal is inadequate for assessing PA pressure.  3. The mitral valve is grossly normal. Trivial mitral valve regurgitation.  4. The aortic valve is tricuspid. Aortic valve regurgitation is mild. Aortic regurgitation PHT measures 574 msec.  5. The inferior vena cava is dilated in size with <50% respiratory variability, suggesting right atrial pressure of 15 mmHg.  6. Telemetry not recorded during study. FINDINGS  Left Ventricle: Left ventricular ejection fraction, by estimation, is 55 to 60%. The left ventricle has normal function. The left ventricle has no regional wall motion abnormalities. The left ventricular internal cavity size was normal in size. There is  mild left ventricular hypertrophy. Left ventricular diastolic parameters are indeterminate. Right Ventricle: The right ventricular size is normal. No increase in right ventricular wall thickness. Right ventricular systolic function is normal. Tricuspid regurgitation signal is inadequate for assessing PA pressure. Left Atrium: Left atrial size was normal in size. Right Atrium: Right atrial size was normal in size. Pericardium: There is no evidence of pericardial effusion. Mitral Valve: The mitral valve is grossly normal. Mild mitral  annular calcification. Trivial mitral valve regurgitation. Tricuspid Valve: The tricuspid valve is grossly normal. Tricuspid valve regurgitation is trivial. Aortic Valve: The aortic valve is tricuspid. There is mild aortic valve annular calcification. Aortic valve regurgitation is mild. Aortic regurgitation PHT measures 574 msec. Pulmonic Valve: The pulmonic valve was grossly normal. Pulmonic valve regurgitation is trivial. Aorta: The aortic root is normal in size and structure. Venous: The inferior vena cava is dilated in size with less than 50% respiratory variability, suggesting right atrial pressure of 15 mmHg. IAS/Shunts: No atrial level shunt detected by color flow Doppler.  LEFT VENTRICLE PLAX 2D LVIDd:         4.04 cm  Diastology LVIDs:         2.96 cm  LV e' medial:    4.78 cm/s LV PW:         1.18 cm  LV E/e' medial:  16.8 LV IVS:        1.19 cm  LV e' lateral:   7.14 cm/s LVOT diam:     2.10 cm  LV E/e' lateral: 11.2 LVOT Area:     3.46 cm  RIGHT VENTRICLE RV S prime:     16.00 cm/s TAPSE (M-mode): 2.6 cm LEFT ATRIUM             Index       RIGHT ATRIUM           Index LA diam:        3.50 cm 1.95 cm/m  RA Area:     14.70 cm LA Vol (A2C):  53.8 ml 29.90 ml/m RA Volume:   45.10 ml  25.07 ml/m LA Vol (A4C):   55.7 ml 30.96 ml/m LA Biplane Vol: 56.4 ml 31.35 ml/m  AORTIC VALVE AI PHT:      574 msec  AORTA Ao Root diam: 2.80 cm MITRAL VALVE MV Area (PHT): 2.99 cm    SHUNTS MV Decel Time: 254 msec    Systemic Diam: 2.10 cm MV E velocity: 80.10 cm/s MV A velocity: 87.80 cm/s MV E/A ratio:  0.91 Rozann Lesches MD Electronically signed by Rozann Lesches MD Signature Date/Time: 04/29/2020/11:39:37 AM    Final     Orson Eva, DO  [facility]  If 7PM-7AM, please contact night-coverage [email or web address] Password [email or web address] 05/01/2020, 3:07 PM   LOS: 3 days

## 2020-05-01 NOTE — Progress Notes (Signed)
Enteric Precautions discontinued. Per GI note 04/29/2020 at 5:35pm infectious disease was contacted regarding pt C. Difficile testing and was determined that C. Difficile was highly unlikely. Last BM was more formed that previous. Will continue to monitor for any changes.

## 2020-05-01 NOTE — Progress Notes (Signed)
GI Inpatient Follow-up Note   Subjective: Reports having several brown liquid stools overnight without any obvious bleeding, denies any nausea/vomiting or abd pain. Had grits for breakfast. No complaints this morning. Daughter at bedside. Discussed w/ RN  Scheduled Inpatient Medications:  . Chlorhexidine Gluconate Cloth  6 each Topical Daily  . docusate sodium  100 mg Oral BID  . feeding supplement  1 Container Oral TID BM    Continuous Inpatient Infusions:   . diltiazem (CARDIZEM) infusion 7.5 mg/hr (05/01/20 0700)  . lactated ringers      PRN Inpatient Medications:  acetaminophen **OR** acetaminophen, labetalol, ondansetron **OR** ondansetron (ZOFRAN) IV, sodium phosphate  Review of Systems: Constitutional: Weight is stable.  Eyes: No changes in vision. ENT: No oral lesions, sore throat.  GI: see HPI.  Heme/Lymph: No easy bruising.  CV: No chest pain.  GU: No hematuria.  Integumentary: No rashes.  Neuro: No headaches.  Psych: No depression/anxiety.  Endocrine: No heat/cold intolerance.  Allergic/Immunologic: No urticaria.  Resp: No cough, SOB.  Musculoskeletal: No joint swelling.    Physical Examination: BP (!) 177/65   Pulse 85   Temp 98 F (36.7 C)   Resp 20   Ht 5\' 5"  (1.651 m)   Wt 72.4 kg   SpO2 94%   BMI 26.56 kg/m  Gen: NAD, alert and oriented x 4 HEENT: PEERLA, EOMI, Neck: supple, no JVD or thyromegaly Chest: CTA bilaterally, no wheezes, crackles, or other adventitious sounds CV: irregular, no m/g/c/r Abd: soft, NT, ND, +BS in all four quadrants; no HSM, guarding, ridigity, or rebound tenderness Ext: no edema, well perfused with 2+ pulses, Skin: no rash or lesions noted Lymph: no LAD  Data: Lab Results  Component Value Date   WBC 7.4 05/01/2020   HGB 13.5 05/01/2020   HCT 42.4 05/01/2020   MCV 92.8 05/01/2020   PLT 253 05/01/2020   Recent Labs  Lab 04/30/20 1746 04/30/20 2213 05/01/20 0526  HGB 13.3 12.4 13.5   Lab Results    Component Value Date   NA 135 05/01/2020   K 4.2 05/01/2020   CL 101 05/01/2020   CO2 22 05/01/2020   BUN 11 05/01/2020   CREATININE 0.68 05/01/2020   Lab Results  Component Value Date   ALT 21 04/28/2020   AST 28 04/28/2020   ALKPHOS 70 04/28/2020   BILITOT 1.0 04/28/2020   No results for input(s): APTT, INR, PTT in the last 168 hours.   Assessment/Plan: Ms. Bonser is a 86 y.o. female admitted 11/14 for watery diarrhea, rectal bleeding, fatigue. CT findings concerning for stercoral colitis. Diarrhea appearedto be more of an overflow incontinence. She was previously seen at [facility] on 11/13 with positive C. difficile toxin; however, this was reviewed with ID who felt patient was likely a carrier with no need to treat.    1. Diarrhea - negative C diff testing at [facility]. Had disimpaction multiple times w/ some oozing following manipulation. Was given bowel prep yesterday to get stool moving. Hgb stable overnight and clinically bleeding has resolved. Had several brown stools overnight after bowel prep. Will hold off on colonoscopy given symptom improvement and stable H/H. Will need long term bowel regimen at discharge, she was not using anything at home prior to admission.    Recommendations: 1. D/c q4 hr H/H checks, monitor CBC qAM 2. Advance to soft diet 3. Monitor for recurrent bleeding.  4. Hold off on colonoscopy for now  Please call with questions or concerns.  Ronney Asters, PA-C [facility]

## 2020-05-02 ENCOUNTER — Inpatient Hospital Stay (HOSPITAL_COMMUNITY): Payer: PPO

## 2020-05-02 DIAGNOSIS — K5289 Other specified noninfective gastroenteritis and colitis: Secondary | ICD-10-CM

## 2020-05-02 DIAGNOSIS — K625 Hemorrhage of anus and rectum: Secondary | ICD-10-CM | POA: Diagnosis not present

## 2020-05-02 DIAGNOSIS — I4891 Unspecified atrial fibrillation: Secondary | ICD-10-CM | POA: Diagnosis not present

## 2020-05-02 DIAGNOSIS — Z7189 Other specified counseling: Secondary | ICD-10-CM | POA: Diagnosis not present

## 2020-05-02 DIAGNOSIS — Z515 Encounter for palliative care: Secondary | ICD-10-CM | POA: Diagnosis not present

## 2020-05-02 DIAGNOSIS — R159 Full incontinence of feces: Secondary | ICD-10-CM

## 2020-05-02 LAB — HEMOGLOBIN AND HEMATOCRIT, BLOOD
HCT: 39.6 % (ref 36.0–46.0)
Hemoglobin: 12.9 g/dL (ref 12.0–15.0)

## 2020-05-02 LAB — CBC
HCT: 36.5 % (ref 36.0–46.0)
Hemoglobin: 11.6 g/dL — ABNORMAL LOW (ref 12.0–15.0)
MCH: 28.9 pg (ref 26.0–34.0)
MCHC: 31.8 g/dL (ref 30.0–36.0)
MCV: 91 fL (ref 80.0–100.0)
Platelets: 243 10*3/uL (ref 150–400)
RBC: 4.01 MIL/uL (ref 3.87–5.11)
RDW: 14.5 % (ref 11.5–15.5)
WBC: 5 10*3/uL (ref 4.0–10.5)
nRBC: 0 % (ref 0.0–0.2)

## 2020-05-02 LAB — MAGNESIUM: Magnesium: 1.8 mg/dL (ref 1.7–2.4)

## 2020-05-02 LAB — BASIC METABOLIC PANEL
Anion gap: 7 (ref 5–15)
BUN: 8 mg/dL (ref 8–23)
CO2: 26 mmol/L (ref 22–32)
Calcium: 7.7 mg/dL — ABNORMAL LOW (ref 8.9–10.3)
Chloride: 101 mmol/L (ref 98–111)
Creatinine, Ser: 0.52 mg/dL (ref 0.44–1.00)
GFR, Estimated: >60 mL/min (ref >60)
Glucose, Bld: 155 mg/dL — ABNORMAL HIGH (ref 70–99)
Potassium: 3.7 mmol/L (ref 3.5–5.1)
Sodium: 134 mmol/L — ABNORMAL LOW (ref 135–145)

## 2020-05-02 MED ORDER — DILTIAZEM HCL 60 MG PO TABS
60.0000 mg | ORAL_TABLET | Freq: Three times a day (TID) | ORAL | Status: DC
  Administered 2020-05-02 (×3): 60 mg via ORAL
  Filled 2020-05-02 (×3): qty 1

## 2020-05-02 MED ORDER — POLYETHYLENE GLYCOL 3350 17 G PO PACK
17.0000 g | PACK | Freq: Two times a day (BID) | ORAL | Status: DC
  Administered 2020-05-02 – 2020-05-04 (×4): 17 g via ORAL
  Filled 2020-05-02 (×4): qty 1

## 2020-05-02 NOTE — Progress Notes (Signed)
Physical Therapy Treatment Patient Details Name: Latoya Long MRN: [id number] DOB: 06-23-1927 Today's Date: 05/02/2020    History of Present Illness Latoya Long is a 86 y.o. female with medical history significant for hypertension.  Patient has been having multiple continuous watery stools since yesterday.  Yesterday and today patient had small amounts of blood in stool.  No abdominal pain.  Patient was weak, today and unable to ambulate.  Patient has a history of chronic constipation.  Patient was seen at [facility] ER yesterday and diagnosed with UTI but not so not given any medications.  Daughter was concerned about stool C. difficile and brought patient to the ED here. Patient and daughter deny history of stroke, no cardiac history.  No history of irregular heart rhythm.  Denies history of frequent falls, daughter reports 2 falls over the past few years which are mechanical falls. Patient never smoked cigarettes, denies difficulty breathing or cough.  No chest pain.  No lower extremity swelling, no recent trips. Denies Dysuria or lower abdominal pain.    PT Comments    Patient demonstrates slow labored movement for sitting up at bedside, limited use of BUE due to weakness and incontinent of stool during sit to stands.  Patient limited to a few slow labored steps at bedside due to weakness, poor standing balance and c/o fatigue.  Patient tolerated sitting up in chair after therapy with her daughter present in room - RN notified.  Patient will benefit from continued physical therapy in hospital and recommended venue below to increase strength, balance, endurance for safe ADLs and gait.    Follow Up Recommendations  SNF     Equipment Recommendations  None recommended by PT    Recommendations for Other Services       Precautions / Restrictions Precautions Precautions: Fall Restrictions Weight Bearing Restrictions: No    Mobility  Bed Mobility Overal bed mobility: Needs Assistance Bed  Mobility: Supine to Sit     Supine to sit: Mod assist     General bed mobility comments: increased time, labored movement  Transfers Overall transfer level: Needs assistance Equipment used: Rolling walker (2 wheeled) Transfers: Sit to/from [facility] Sit to Stand: Mod assist Stand pivot transfers: Mod assist       General transfer comment: slow labored movement  Ambulation/Gait Ambulation/Gait assistance: Mod assist;Max assist Gait Distance (Feet): 5 Feet Assistive device: Rolling walker (2 wheeled) Gait Pattern/deviations: Decreased step length - left;Decreased step length - right;Decreased stride length Gait velocity: decreased   General Gait Details: limited to 5-6 slow labored unsteady steps at bedside due to BLE weakness, poor standing balance with frequent buckling of knees   Stairs             Wheelchair Mobility    Modified Rankin (Stroke Patients Only)       Balance Overall balance assessment: Needs assistance Sitting-balance support: Feet supported;No upper extremity supported Sitting balance-Leahy Scale: Fair Sitting balance - Comments: fair/good seated at EOB   Standing balance support: During functional activity;Bilateral upper extremity supported Standing balance-Leahy Scale: Poor Standing balance comment: fair/poor using RW                            Cognition   Behavior During Therapy: WFL for tasks assessed/performed Overall Cognitive Status: Within Functional Limits for tasks assessed  Exercises General Exercises - Lower Extremity Long Arc Quad: Seated;AROM;Strengthening;Both;10 reps Hip Flexion/Marching: Seated;Strengthening;Both;10 reps;AAROM Toe Raises: Seated;AROM;Strengthening;Both;10 reps Heel Raises: Seated;AROM;Strengthening;Both;10 reps    General Comments        Pertinent Vitals/Pain Pain Assessment: Faces Faces Pain Scale: Hurts a  little bit Pain Location: over bottom due to frequent diarrhea Pain Descriptors / Indicators: Sore;Discomfort Pain Intervention(s): Limited activity within patient's tolerance;Monitored during session;Repositioned    Home Living                      Prior Function            PT Goals (current goals can now be found in the care plan section) Acute Rehab PT Goals Patient Stated Goal: return home with family to assist PT Goal Formulation: With patient Time For Goal Achievement: 05/14/20 Potential to Achieve Goals: Good Progress towards PT goals: Progressing toward goals    Frequency    Min 3X/week      PT Plan Current plan remains appropriate    Co-evaluation              AM-PAC PT "6 Clicks" Mobility   Outcome Measure  Help needed turning from your back to your side while in a flat bed without using bedrails?: A Lot Help needed moving from lying on your back to sitting on the side of a flat bed without using bedrails?: A Lot Help needed moving to and from a bed to a chair (including a wheelchair)?: A Lot Help needed standing up from a chair using your arms (e.g., wheelchair or bedside chair)?: A Lot Help needed to walk in hospital room?: A Lot Help needed climbing 3-5 steps with a railing? : Total 6 Click Score: 11    End of Session   Activity Tolerance: Patient tolerated treatment well;Patient limited by fatigue Patient left: in chair;with call bell/phone within reach;with family/visitor present Nurse Communication: Mobility status PT Visit Diagnosis: Unsteadiness on feet (R26.81);Other abnormalities of gait and mobility (R26.89);Muscle weakness (generalized) (M62.81)     Time: 2197-5883 PT Time Calculation (min) (ACUTE ONLY): 28 min  Charges:  $Therapeutic Exercise: 8-22 mins $Therapeutic Activity: 8-22 mins                     12:01 PM, 05/02/20 Lonell Grandchild, MPT Physical Therapist with [facility] [phone]  office [phone] mobile phone

## 2020-05-02 NOTE — TOC Progression Note (Signed)
Transition of Care [facility]) - Progression Note    Patient Details  Name: Latoya Long MRN: [id number] Date of Birth: June 12, 1928  [facility] CM/SW Contact  Ihor Gully, LCSW Phone Number: 05/02/2020, 5:05 PM  Clinical Narrative:    Patient and daughter, [name], have reconsidered and desire SNF at discharge. Choices provided. Referral made. Patient is not vaccinated for COVID19.    Expected Discharge Plan: [place] Barriers to Discharge: Continued Medical Work up  Expected Discharge Plan and Services Expected Discharge Plan: [place] arrangements for the past 2 months: [place] Arranged: RN, PT [facility] Agency: [place] ([facility]) Date [place]: 05/02/20 Time [place]: [place] Representative spoke with at [place]: [place] ([place]) Interventions    Readmission Risk Interventions No flowsheet data found.

## 2020-05-02 NOTE — Progress Notes (Signed)
Cardizem gtt stopped at 1004. Oral medication given. HR's have been sustaining in the 80-90's bpm range. Will continue to monitor.

## 2020-05-02 NOTE — Progress Notes (Signed)
Palliative: Latoya Long is resting quietly in bed.  She greets me, making and keeping eye contact.  She has no questions or concerns.  Daughter Latoya Long is at bedside. We talk about GI consutl.  As we are talking, Latoya Long arrives.  He does an exam and consults with patient and daughter. All questions answered.   We talk about disposition.  At this point, patient and family are recognizing that Latoya Long is becoming weaker and would benefit from STR.  Preference with [facility].   Detailed conference with attending, GI MD, bedside nursing staff, RT and TOC related to patient condition, needs and disposition options.   Plan:   Continue to treat the treatable, no CPR or intubation. Now agreeable to STR dt continuing weakness.    68 minutes Latoya Axe, NP Palliative Medicine Team Team Phone # [phone] Greater than 50% of this time was spent counseling and coordinating care related to the above assessment and plan.

## 2020-05-02 NOTE — Progress Notes (Signed)
2L oxygen applied to patient when she sleeps as she desat's into the mid 80's when she is sleeping. Example of 85% earlier in the day around 1300. Pt sat's are normal when awake. Will continue to monitor.

## 2020-05-02 NOTE — Progress Notes (Signed)
Subjective:  Daughter at bedside. Patient complains of rectal pain. Can't sit up for long. No abdominal pain. Does not feel like eating due to constant flow of stool.   Objective: Vital signs in last 24 hours: Temp:  [97.7 F (36.5 C)-98.1 F (36.7 C)] 97.9 F (36.6 C) (11/18 0752) Pulse Rate:  [71-91] 80 (11/18 1600) Resp:  [14-26] 19 (11/18 1600) BP: (121-191)/(41-93) 158/68 (11/18 1600) SpO2:  [89 %-98 %] 95 % (11/18 1600) Weight:  [79.2 kg] 79.2 kg (11/18 0544) Last BM Date: 05/02/20 General:   Alert,  Frail appearing female in NAD. Head:  Normocephalic and atraumatic. Eyes:  Sclera clear, no icterus.  Abdomen:  Soft, nontender and nondistended. Normal bowel sounds, without guarding, and without rebound.   Rectal: perianal area with mild erythema. Some external hemorrhoids noted. Soft brown liquid stool with fresh blood noted.  Extremities:  Without clubbing, deformity or edema. Neurologic:  Alert and  oriented x4;  grossly normal neurologically. Skin:  Intact without significant lesions or rashes. Psych:  Alert and cooperative. Normal mood and affect.  Intake/Output from previous day: 11/17 0701 - 11/18 0700 In: 1186.3 [I.V.:86.3] Out: 1403 [Urine:1400; Stool:3] Intake/Output this shift: Total I/O In: 170.7 [I.V.:170.7] Out: -   Lab Results: CBC Recent Labs    04/30/20 0538 04/30/20 1006 05/01/20 0526 05/01/20 0934 05/02/20 0613  WBC 8.5  --  7.4  --  5.0  HGB 12.8   < > 13.5 13.3 11.6*  HCT 41.3   < > 42.4 42.3 36.5  MCV 93.2  --  92.8  --  91.0  PLT 247  --  253  --  243   < > = values in this interval not displayed.   BMET Recent Labs    04/30/20 0538 05/01/20 0526 05/02/20 0613  NA 133* 135 134*  K 4.4 4.2 3.7  CL 99 101 101  CO2 25 22 26   GLUCOSE 104* 101* 155*  BUN 15 11 8   CREATININE 0.78 0.68 0.52  CALCIUM 7.9* 8.0* 7.7*   LFTs No results for input(s): BILITOT, BILIDIR, IBILI, ALKPHOS, AST, ALT, PROT, ALBUMIN in the last 72 hours. No  results for input(s): LIPASE in the last 72 hours. PT/INR No results for input(s): LABPROT, INR in the last 72 hours.    Imaging Studies: DG Chest 1 View  Result Date: 04/28/2020 CLINICAL DATA:  AFib EXAM: CHEST  1 VIEW COMPARISON:  April 27, 2020 FINDINGS: The heart size is stable. There is a trace right-sided pleural effusion. There is a persistent airspace opacity at the left lung base. The heart size is borderline enlarged. Aortic calcifications are noted. There is no pneumothorax. There is mild vascular congestion without overt pulmonary edema. IMPRESSION: Trace right-sided pleural effusion and bibasilar atelectasis. Electronically Signed   By: Constance Holster M.D.   On: 04/28/2020 23:30   CT ABDOMEN PELVIS W CONTRAST  Result Date: 04/28/2020 CLINICAL DATA:  86 year old female with concern for acute diverticulitis. EXAM: CT ABDOMEN AND PELVIS WITH CONTRAST TECHNIQUE: Multidetector CT imaging of the abdomen and pelvis was performed using the standard protocol following bolus administration of intravenous contrast. CONTRAST:  155mL OMNIPAQUE IOHEXOL 300 MG/ML  SOLN COMPARISON:  CT abdomen pelvis dated 11/06/2013. FINDINGS: Lower chest: There are bibasilar dependent atelectasis. There is a cluster of density in the left lower lobe which may represent atelectasis or infiltrate. Clinical correlation and follow-up to resolution recommended. No intra-abdominal free air or free fluid. Hepatobiliary: No focal liver abnormality is seen. No  gallstones, gallbladder wall thickening, or biliary dilatation. Pancreas: Unremarkable. No pancreatic ductal dilatation or surrounding inflammatory changes. Spleen: Normal in size without focal abnormality. Adrenals/Urinary Tract: The adrenal glands unremarkable. There is no hydronephrosis on either side. There is symmetric enhancement and excretion of contrast by both kidneys. The visualized ureters and urinary bladder appear unremarkable. Stomach/Bowel: There is  large stool in the rectal vault. There is inflammatory changes of the perirectal fat concerning for stercoral colitis. Clinical correlation is recommended. There is no bowel obstruction. The appendix is normal. Vascular/Lymphatic: Advanced aortoiliac atherosclerotic disease. The IVC is unremarkable. No portal venous gas. There is no adenopathy. Reproductive: There is a 1 cm anterior fundal calcified fibroid. The ovaries are unremarkable. Other: Stranding and inflammatory changes of the presacral fat likely related to inflammatory/infectious process involving the rectal vault. No drainable fluid collection. Musculoskeletal: Osteopenia with extensive degenerative changes of the spine. Bilateral L5 pars defects with grade 1 L5-S1 anterolisthesis. Old-appearing compression fracture of the superior endplate of L1 with approximately 50% loss of vertebral body height. No acute osseous pathology. IMPRESSION: Large stool in the rectal vault with findings concerning for stercoral colitis. Clinical correlation is recommended. No bowel obstruction. Normal appendix. Electronically Signed   By: Anner Crete M.D.   On: 04/28/2020 19:05   ECHOCARDIOGRAM COMPLETE  Result Date: 04/29/2020    ECHOCARDIOGRAM REPORT   Patient Name:   Latoya Long Date of Exam: 04/29/2020 Medical Rec #:  [id number]      Height:       65.0 in Accession #:    [id number]     Weight:       160.0 lb Date of Birth:  09-21-1927      BSA:          1.799 m Patient Age:    64 years       BP:           119/32 mmHg Patient Gender: F              HR:           86 bpm. Exam Location:  [facility] Procedure: 2D Echo Indications:    Atrial Fibrillation 427.31 / I48.91  History:        Patient has no prior history of Echocardiogram examinations.                 Arrythmias:Atrial Fibrillation; Risk Factors:Hypertension and                 Non-Smoker.  Sonographer:    Leavy Cella RDCS (AE) Referring Phys: [place]  1. Left  ventricular ejection fraction, by estimation, is 55 to 60%. The left ventricle has normal function. The left ventricle has no regional wall motion abnormalities. There is mild left ventricular hypertrophy. Left ventricular diastolic parameters are indeterminate.  2. Right ventricular systolic function is normal. The right ventricular size is normal. Tricuspid regurgitation signal is inadequate for assessing PA pressure.  3. The mitral valve is grossly normal. Trivial mitral valve regurgitation.  4. The aortic valve is tricuspid. Aortic valve regurgitation is mild. Aortic regurgitation PHT measures 574 msec.  5. The inferior vena cava is dilated in size with <50% respiratory variability, suggesting right atrial pressure of 15 mmHg.  6. Telemetry not recorded during study. FINDINGS  Left Ventricle: Left ventricular ejection fraction, by estimation, is 55 to 60%. The left ventricle has normal function. The left ventricle has no regional wall motion abnormalities. The left  ventricular internal cavity size was normal in size. There is  mild left ventricular hypertrophy. Left ventricular diastolic parameters are indeterminate. Right Ventricle: The right ventricular size is normal. No increase in right ventricular wall thickness. Right ventricular systolic function is normal. Tricuspid regurgitation signal is inadequate for assessing PA pressure. Left Atrium: Left atrial size was normal in size. Right Atrium: Right atrial size was normal in size. Pericardium: There is no evidence of pericardial effusion. Mitral Valve: The mitral valve is grossly normal. Mild mitral annular calcification. Trivial mitral valve regurgitation. Tricuspid Valve: The tricuspid valve is grossly normal. Tricuspid valve regurgitation is trivial. Aortic Valve: The aortic valve is tricuspid. There is mild aortic valve annular calcification. Aortic valve regurgitation is mild. Aortic regurgitation PHT measures 574 msec. Pulmonic Valve: The pulmonic  valve was grossly normal. Pulmonic valve regurgitation is trivial. Aorta: The aortic root is normal in size and structure. Venous: The inferior vena cava is dilated in size with less than 50% respiratory variability, suggesting right atrial pressure of 15 mmHg. IAS/Shunts: No atrial level shunt detected by color flow Doppler.  LEFT VENTRICLE PLAX 2D LVIDd:         4.04 cm  Diastology LVIDs:         2.96 cm  LV e' medial:    4.78 cm/s LV PW:         1.18 cm  LV E/e' medial:  16.8 LV IVS:        1.19 cm  LV e' lateral:   7.14 cm/s LVOT diam:     2.10 cm  LV E/e' lateral: 11.2 LVOT Area:     3.46 cm  RIGHT VENTRICLE RV S prime:     16.00 cm/s TAPSE (M-mode): 2.6 cm LEFT ATRIUM             Index       RIGHT ATRIUM           Index LA diam:        3.50 cm 1.95 cm/m  RA Area:     14.70 cm LA Vol (A2C):   53.8 ml 29.90 ml/m RA Volume:   45.10 ml  25.07 ml/m LA Vol (A4C):   55.7 ml 30.96 ml/m LA Biplane Vol: 56.4 ml 31.35 ml/m  AORTIC VALVE AI PHT:      574 msec  AORTA Ao Root diam: 2.80 cm MITRAL VALVE MV Area (PHT): 2.99 cm    SHUNTS MV Decel Time: 254 msec    Systemic Diam: 2.10 cm MV E velocity: 80.10 cm/s MV A velocity: 87.80 cm/s MV E/A ratio:  0.91 Rozann Lesches MD Electronically signed by Rozann Lesches MD Signature Date/Time: 04/29/2020/11:39:37 AM    Final   [2 weeks]   Assessment: 86 year old female with reports of watery diarrhea and rectal bleeding presenting to the ED November 14 with findings on CT of large stool in the rectal vault concerning for stercoral colitis.  Diarrhea appear to be more of an overflow incontinence in the setting however at [facility] on November 13 she had a positive C. difficile toxin.  ID did not feel she had active Cdiff.   She underwent manual disimpaction in the ED yesterday with a significant amount of stool removed and evidence of low-volume bleeding at the time of rectal exam.  C. difficile testing at our facility was not successful in light of formed bowel  movement.  Repeat disimpaction November 15.  She received bowel prep night of the 16th.  Patient continues to  have oozing of soft liquid stool with fresh blood, more burgundy a couple of days ago.  Yesterday her hemoglobin had remained stable in the 13 range.  No plans for endoscopic evaluation unless bleeding persisting or hemoglobin drops.  Today her hemoglobin is down to 11.6. patient's daughter in agreement to monitor for persistent bleeding or further drop in H/H.  Hospital course complicated by newly recognized A. fib/flutter with RVR. Troponin I elevated at 272.   Plan: 1. Recheck H/H in am.  2. Monitor for persistent bleeding. 3. Avoid rectal tube, discussed with nursing.  4. Add miralax 17 grams twice daily.   Laureen Ochs. Bernarda Caffey [facility] [phone] 11/18/20214:39 PM     LOS: 4 days

## 2020-05-02 NOTE — NC FL2 (Signed)
[place] MEDICAID FL2 LEVEL OF CARE SCREENING TOOL     IDENTIFICATION  Patient Name: Latoya Long Birthdate: 09-Feb-1928 Sex: female Admission Date (Current Location): 04/28/2020  [facility] and [place] Number:  [employer] and Address:  [place] [address], [place]      Provider Number: [id number]  Attending Physician Name and Address:  Orson Eva, MD  Relative Name and Phone Number:  Ernesta Amble (Daughter) [phone] (Mobile    Current Level of Care: Hospital Recommended Level of Care: [place] Prior Approval Number:    Date Approved/Denied:   PASRR Number: [id number]  Discharge Plan: SNF    Current Diagnoses: Patient Active Problem List   Diagnosis Date Noted  . Encopresis with constipation and overflow incontinence   . Goals of care, counseling/discussion   . Palliative care by specialist   . DNR (do not resuscitate) discussion   . Atrial fibrillation with rapid ventricular response ([place]) 04/28/2020  . 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 HTN (hypertension) 04/28/2020  . Rectal bleeding 04/28/2020    Orientation RESPIRATION BLADDER Height & Weight     Self, Time, Situation, Place  Normal Incontinent Weight: 174 lb 9.7 oz (79.2 kg) Height:  5\' 5"  (165.1 cm)  BEHAVIORAL SYMPTOMS/MOOD NEUROLOGICAL BOWEL NUTRITION STATUS      Continent Diet (soft)  AMBULATORY STATUS COMMUNICATION OF NEEDS Skin   Limited Assist Verbally Normal                       Personal Care Assistance Level of Assistance  Bathing, Feeding, Dressing Bathing Assistance: Limited assistance Feeding assistance: Independent Dressing Assistance: Limited assistance     Functional Limitations Info  Sight, Hearing, Speech Sight Info: Adequate Hearing Info: Adequate Speech Info: Adequate    SPECIAL CARE FACTORS FREQUENCY  PT (By licensed PT)     PT Frequency: 5x/week              Contractures Contractures Info:  Not present    Additional Factors Info  Code Status, Allergies Code Status Info: DNR Allergies Info: NKA           Current Medications (05/02/2020):  This is the current hospital active medication list Current Facility-Administered Medications  Medication Dose Route Frequency Provider Last Rate Last Admin  . acetaminophen (TYLENOL) tablet 650 mg  650 mg Oral Q6H PRN Emokpae, Ejiroghene E, MD       Or  . acetaminophen (TYLENOL) suppository 650 mg  650 mg Rectal Q6H PRN Emokpae, Ejiroghene E, MD      . Chlorhexidine Gluconate Cloth 2 % PADS 6 each  6 each Topical Daily Wynetta Emery, Clanford L, MD   6 each at 05/02/20 1008  . diltiazem (CARDIZEM) 125 mg in dextrose 5% 125 mL (1 mg/mL) infusion  5-15 mg/hr Intravenous Titrated Wynetta Emery, Clanford L, MD   Stopped at 05/02/20 1004  . diltiazem (CARDIZEM) tablet 60 mg  60 mg Oral TID Arnoldo Lenis, MD   60 mg at 05/02/20 1649  . docusate sodium (COLACE) capsule 100 mg  100 mg Oral BID Annitta Needs, NP   100 mg at 05/02/20 1006  . feeding supplement (BOOST / RESOURCE BREEZE) liquid 1 Container  1 Container Oral TID BM Johnson, Clanford L, MD   1 Container at 05/02/20 1322  . labetalol (NORMODYNE) injection 5 mg  5 mg Intravenous Q2H PRN Johnson, Clanford L, MD      . lactated ringers infusion  Intravenous Continuous Johnson, Clanford L, MD      . ondansetron (ZOFRAN) tablet 4 mg  4 mg Oral Q6H PRN Emokpae, Ejiroghene E, MD       Or  . ondansetron (ZOFRAN) injection 4 mg  4 mg Intravenous Q6H PRN Emokpae, Ejiroghene E, MD      . polyethylene glycol (MIRALAX / GLYCOLAX) packet 17 g  17 g Oral BID Mahala Menghini, PA-C      . sodium phosphate (FLEET) 7-19 GM/118ML enema 1 enema  1 enema Rectal Once PRN Emokpae, Ejiroghene E, MD         Discharge Medications: Please see discharge summary for a list of discharge medications.  Relevant Imaging Results:  Relevant Lab Results:   Additional Information SSN [id number]. Patient is NOT  vaccinated for COVID-19.  Erianna Jolly, Clydene Pugh, LCSW

## 2020-05-02 NOTE — Progress Notes (Signed)
PROGRESS NOTE  Latoya Long YIF:[id number] DOB: [birth year] DOA: 04/28/2020 PCP: Rory Percy, MD  Brief History:  86 y.o.femalewith medical history significant forhypertension.Patient has been having multiple continuous watery stools since yesterday. Yesterday and today patient had small amounts ofblood in stool. No abdominal pain. Patient was weak, today and unable to ambulate. Patient has a history of chronic constipation.Patient was seen at [facility] ER yesterday and diagnosed with UTI but not so not given any medications. Daughter was concerned about stool C. difficile and brought patient to the ED here. Patient and daughter deny history of stroke, no cardiac history. No history of irregular heart rhythm. Denies history of frequent falls, daughter reports 2 falls over the past few years which are mechanical falls. Patient never smoked cigarettes, denies difficulty breathing or cough. No chest pain. No lower extremity swelling, no recent trips. Denies Dysuria or lower abdominal pain.   ED Course:heart rateinitially in the 90s increased to 159.O2 sats initially greater than 92% on room air, after procedure dropped tomid 80s placed on 3 L nasal cannula.EKG showing atrial fibrillation with RVR. Magnesium 2.2.WBC 11.9. Large stool in the rectal vault concerning for stercoral colitis. Fecal disimpaction attempted in the EDlargelyunsuccessful,with small amount of blood in stool. Cardizem drip started. 500 mill bolus given. Hospitalist admit for atrial fibrillation with RVR, and stercoral colitis and rectal bleeding.  Assessment/Plan: Paroxysmal atrial fibrillation with RVR -CHA2DS2-VASc score is 5 -back in sinus this morning -discontinue IV diltiazem>>po diltiazem -04/29/20 Echo--EF 55-60%, no WMA -not on AC due to GIB -TSH 1.143  Hematochezia/GI Bleed -appreciate GI consult/follow up -given bowel prep 11/16 -05/02/20--had 2 BMs with hematochezia,  but less volume than 11/16 -05/02/20-- Hgb drop 13.3>>11.6 -further eval per GI  Hypertension -continue IV diltiazem>>po diltiazem -not on any agents prior to hospitalization  Stercoral colitis -presenting with diarrhea likely postobstructive. Colitis likely from fecal impaction.Fecal disimpaction attempted in the ED likely unsuccessful. -initially started IV ceftriaxone and metronidazole--stopped 11/15  Acute respiratory Failure with hypoxia -due to atelectatic disease -initially 87% RA -placed on 2L -weaned off oxygen now -IS -personally reviewed CXR--no edema or infiltrates  Chronic constipation with fecal impaction -  -unsuccessful disimpaction in ED, appreciate GI recs for an appropriate bowel regimen   Goals of Care -palliative consulted -patient now DNR          Status is: Inpatient  Remains inpatient appropriate because:IV treatments appropriate due to intensity of illness or inability to take PO   Dispo: The patient is from: Home  Anticipated d/c is to: Home--refuses SNF  Anticipated d/c date is: 2 days  Patient currently is not medically stable to d/c.        Family Communication:   Daughter updated at bedside11/17  Consultants:  GI  Code Status:  DNR  DVT Prophylaxis:  SCDs   Procedures: As Listed in Progress Note Above  Antibiotics: Ceftriaxone 11/14>>11/15 azithro 11/14>>11/15         Subjective: Patient denies f/c, cp, sob, abd pain.  Had 2 small volume bloody stools.  Denies dysuria, hematuria, headache, f/c  Objective: Vitals:   05/02/20 1130 05/02/20 1200 05/02/20 1230 05/02/20 1300  BP:      Pulse: 84 82 81 90  Resp: 15 (!) 24 (!) 22 (!) 25  Temp:      TempSrc:      SpO2: 94% 94% (!) 89% 96%  Weight:      Height:  Intake/Output Summary (Last 24 hours) at 05/02/2020 1334 Last data filed at 05/02/2020 1008 Gross per 24 hour    Intake 1356.97 ml  Output 1403 ml  Net -46.03 ml   Weight change:  Exam:   General:  Pt is alert, follows commands appropriately, not in acute distress  HEENT: No icterus, No thrush, No neck mass, [place]/AT  Cardiovascular: RRR, S1/S2, no rubs, no gallops  Respiratory: CTA bilaterally, no wheezing, no crackles, no rhonchi  Abdomen: Soft/+BS, non tender, non distended, no guarding  Extremities: No edema, No lymphangitis, No petechiae, No rashes, no synovitis   Data Reviewed: I have personally reviewed following labs and imaging studies Basic Metabolic Panel: Recent Labs  Lab 04/28/20 1749 04/29/20 0810 04/30/20 0538 05/01/20 0526 05/02/20 0613  NA 135 135 133* 135 134*  K 4.2 4.1 4.4 4.2 3.7  CL 103 103 99 101 101  CO2 23 21* 25 22 26   GLUCOSE 126* 90 104* 101* 155*  BUN 24* 20 15 11 8   CREATININE 0.93 0.76 0.78 0.68 0.52  CALCIUM 8.1* 8.1* 7.9* 8.0* 7.7*  MG 2.2  --  2.1 2.1 1.8   Liver Function Tests: Recent Labs  Lab 04/28/20 1749  AST 28  ALT 21  ALKPHOS 70  BILITOT 1.0  PROT 6.4*  ALBUMIN 3.1*   Recent Labs  Lab 04/28/20 1749  LIPASE 19   No results for input(s): AMMONIA in the last 168 hours. Coagulation Profile: No results for input(s): INR, PROTIME in the last 168 hours. CBC: Recent Labs  Lab 04/28/20 1749 04/29/20 0316 04/29/20 0810 04/29/20 0810 04/29/20 1105 04/29/20 1105 04/30/20 0538 04/30/20 1006 04/30/20 1746 04/30/20 2213 05/01/20 0526 05/01/20 0934 05/02/20 0613  WBC 11.9*  --  9.5  --  8.8  --  8.5  --   --   --  7.4  --  5.0  NEUTROABS 10.0*  --   --   --  7.1  --   --   --   --   --   --   --   --   HGB 14.0   < > 13.7   < > 12.5   < > 12.8   < > 13.3 12.4 13.5 13.3 11.6*  HCT 44.2   < > 44.0   < > 39.0   < > 41.3   < > 41.9 39.8 42.4 42.3 36.5  MCV 93.8  --  95.2  --  92.6  --  93.2  --   --   --  92.8  --  91.0  PLT 248  --  212  --  220  --  247  --   --   --  253  --  243   < > = values in this interval not  displayed.   Cardiac Enzymes: No results for input(s): CKTOTAL, CKMB, CKMBINDEX, TROPONINI in the last 168 hours. BNP: Invalid input(s): POCBNP CBG: Recent Labs  Lab 04/30/20 1147  GLUCAP 174*   HbA1C: No results for input(s): HGBA1C in the last 72 hours. Urine analysis:    Component Value Date/Time   COLORURINE YELLOW 11/06/2013 [place] 11/06/2013 1045   LABSPEC <1.005 (L) 11/06/2013 1045   PHURINE 6.0 11/06/2013 1045   GLUCOSEU NEGATIVE 11/06/2013 1045   HGBUR TRACE (A) 11/06/2013 [place] 11/06/2013 [place] 11/06/2013 1045   PROTEINUR NEGATIVE 11/06/2013 1045   UROBILINOGEN 0.2 11/06/2013 1045   NITRITE NEGATIVE  11/06/2013 [place] 11/06/2013 1045   Sepsis Labs: @LABRCNTIP (procalcitonin:4,lacticidven:4) ) Recent Results (from the past 240 hour(s))  Respiratory Panel by RT PCR (Flu A&B, Covid) -     Status: None   Collection Time: 04/28/20  4:23 PM  Result Value Ref Range Status   SARS Coronavirus 2 by RT PCR NEGATIVE NEGATIVE Final    Comment: (NOTE) SARS-CoV-2 target nucleic acids are NOT DETECTED.  The SARS-CoV-2 RNA is generally detectable in upper respiratoy specimens during the acute phase of infection. The lowest concentration of SARS-CoV-2 viral copies this assay can detect is 131 copies/mL. A negative result does not preclude SARS-Cov-2 infection and should not be used as the sole basis for treatment or other patient management decisions. A negative result may occur with  improper specimen collection/handling, submission of specimen other than nasopharyngeal swab, presence of viral mutation(s) within the areas targeted by this assay, and inadequate number of viral copies (<131 copies/mL). A negative result must be combined with clinical observations, patient history, and epidemiological information. The expected result is Negative.  Fact Sheet for Patients:    PinkCheek.be  Fact Sheet for Healthcare Providers:  GravelBags.it  This test is no t yet approved or cleared by the [place] FDA and  has been authorized for detection and/or diagnosis of SARS-CoV-2 by FDA under an Emergency Use Authorization (EUA). This EUA will remain  in effect (meaning this test can be used) for the duration of the COVID-19 declaration under Section 564(b)(1) of the Act, 21 U.S.C. section 360bbb-3(b)(1), unless the authorization is terminated or revoked sooner.     Influenza A by PCR NEGATIVE NEGATIVE Final   Influenza B by PCR NEGATIVE NEGATIVE Final    Comment: (NOTE) The Xpert Xpress SARS-CoV-2/FLU/RSV assay is intended as an aid in  the diagnosis of influenza from Nasopharyngeal swab specimens and  should not be used as a sole basis for treatment. Nasal washings and  aspirates are unacceptable for Xpert Xpress SARS-CoV-2/FLU/RSV  testing.  Fact Sheet for Patients: PinkCheek.be  Fact Sheet for Healthcare Providers: GravelBags.it  This test is not yet approved or cleared by the [place] FDA and  has been authorized for detection and/or diagnosis of SARS-CoV-2 by  FDA under an Emergency Use Authorization (EUA). This EUA will remain  in effect (meaning this test can be used) for the duration of the  Covid-19 declaration under Section 564(b)(1) of the Act, 21  U.S.C. section 360bbb-3(b)(1), unless the authorization is  terminated or revoked. Performed at [facility], [address] [place], [place] [address]   MRSA PCR Screening     Status: None   Collection Time: 04/29/20  2:21 PM   Specimen: Nasal Mucosa; Nasopharyngeal  Result Value Ref Range Status   MRSA by PCR NEGATIVE NEGATIVE Final    Comment:        The GeneXpert MRSA Assay (FDA approved for NASAL specimens only), is one component of a comprehensive MRSA  colonization surveillance program. It is not intended to diagnose MRSA infection nor to guide or monitor treatment for MRSA infections. Performed at [facility], [address] [place], [place] [address]      Scheduled Meds: . Chlorhexidine Gluconate Cloth  6 each Topical Daily  . diltiazem  60 mg Oral TID  . docusate sodium  100 mg Oral BID  . feeding supplement  1 Container Oral TID BM   Continuous Infusions: . diltiazem (CARDIZEM) infusion Stopped (05/02/20 1004)  . lactated ringers  Procedures/Studies: DG Chest 1 View  Result Date: 04/28/2020 CLINICAL DATA:  AFib EXAM: CHEST  1 VIEW COMPARISON:  April 27, 2020 FINDINGS: The heart size is stable. There is a trace right-sided pleural effusion. There is a persistent airspace opacity at the left lung base. The heart size is borderline enlarged. Aortic calcifications are noted. There is no pneumothorax. There is mild vascular congestion without overt pulmonary edema. IMPRESSION: Trace right-sided pleural effusion and bibasilar atelectasis. Electronically Signed   By: Constance Holster M.D.   On: 04/28/2020 23:30   CT ABDOMEN PELVIS W CONTRAST  Result Date: 04/28/2020 CLINICAL DATA:  86 year old female with concern for acute diverticulitis. EXAM: CT ABDOMEN AND PELVIS WITH CONTRAST TECHNIQUE: Multidetector CT imaging of the abdomen and pelvis was performed using the standard protocol following bolus administration of intravenous contrast. CONTRAST:  127mL OMNIPAQUE IOHEXOL 300 MG/ML  SOLN COMPARISON:  CT abdomen pelvis dated 11/06/2013. FINDINGS: Lower chest: There are bibasilar dependent atelectasis. There is a cluster of density in the left lower lobe which may represent atelectasis or infiltrate. Clinical correlation and follow-up to resolution recommended. No intra-abdominal free air or free fluid. Hepatobiliary: No focal liver abnormality is seen. No gallstones, gallbladder wall thickening, or biliary dilatation. Pancreas:  Unremarkable. No pancreatic ductal dilatation or surrounding inflammatory changes. Spleen: Normal in size without focal abnormality. Adrenals/Urinary Tract: The adrenal glands unremarkable. There is no hydronephrosis on either side. There is symmetric enhancement and excretion of contrast by both kidneys. The visualized ureters and urinary bladder appear unremarkable. Stomach/Bowel: There is large stool in the rectal vault. There is inflammatory changes of the perirectal fat concerning for stercoral colitis. Clinical correlation is recommended. There is no bowel obstruction. The appendix is normal. Vascular/Lymphatic: Advanced aortoiliac atherosclerotic disease. The IVC is unremarkable. No portal venous gas. There is no adenopathy. Reproductive: There is a 1 cm anterior fundal calcified fibroid. The ovaries are unremarkable. Other: Stranding and inflammatory changes of the presacral fat likely related to inflammatory/infectious process involving the rectal vault. No drainable fluid collection. Musculoskeletal: Osteopenia with extensive degenerative changes of the spine. Bilateral L5 pars defects with grade 1 L5-S1 anterolisthesis. Old-appearing compression fracture of the superior endplate of L1 with approximately 50% loss of vertebral body height. No acute osseous pathology. IMPRESSION: Large stool in the rectal vault with findings concerning for stercoral colitis. Clinical correlation is recommended. No bowel obstruction. Normal appendix. Electronically Signed   By: Anner Crete M.D.   On: 04/28/2020 19:05   ECHOCARDIOGRAM COMPLETE  Result Date: 04/29/2020    ECHOCARDIOGRAM REPORT   Patient Name:   Latoya Long Date of Exam: 04/29/2020 Medical Rec #:  [id number]      Height:       65.0 in Accession #:    [id number]     Weight:       160.0 lb Date of Birth:  09-27-1927      BSA:          1.799 m Patient Age:    42 years       BP:           119/32 mmHg Patient Gender: F              HR:           86 bpm.  Exam Location:  [facility] Procedure: 2D Echo Indications:    Atrial Fibrillation 427.31 / I48.91  History:        Patient has no prior history of Echocardiogram examinations.  Arrythmias:Atrial Fibrillation; Risk Factors:Hypertension and                 Non-Smoker.  Sonographer:    Leavy Cella RDCS (AE) Referring Phys: [place]  1. Left ventricular ejection fraction, by estimation, is 55 to 60%. The left ventricle has normal function. The left ventricle has no regional wall motion abnormalities. There is mild left ventricular hypertrophy. Left ventricular diastolic parameters are indeterminate.  2. Right ventricular systolic function is normal. The right ventricular size is normal. Tricuspid regurgitation signal is inadequate for assessing PA pressure.  3. The mitral valve is grossly normal. Trivial mitral valve regurgitation.  4. The aortic valve is tricuspid. Aortic valve regurgitation is mild. Aortic regurgitation PHT measures 574 msec.  5. The inferior vena cava is dilated in size with <50% respiratory variability, suggesting right atrial pressure of 15 mmHg.  6. Telemetry not recorded during study. FINDINGS  Left Ventricle: Left ventricular ejection fraction, by estimation, is 55 to 60%. The left ventricle has normal function. The left ventricle has no regional wall motion abnormalities. The left ventricular internal cavity size was normal in size. There is  mild left ventricular hypertrophy. Left ventricular diastolic parameters are indeterminate. Right Ventricle: The right ventricular size is normal. No increase in right ventricular wall thickness. Right ventricular systolic function is normal. Tricuspid regurgitation signal is inadequate for assessing PA pressure. Left Atrium: Left atrial size was normal in size. Right Atrium: Right atrial size was normal in size. Pericardium: There is no evidence of pericardial effusion. Mitral Valve: The mitral valve is  grossly normal. Mild mitral annular calcification. Trivial mitral valve regurgitation. Tricuspid Valve: The tricuspid valve is grossly normal. Tricuspid valve regurgitation is trivial. Aortic Valve: The aortic valve is tricuspid. There is mild aortic valve annular calcification. Aortic valve regurgitation is mild. Aortic regurgitation PHT measures 574 msec. Pulmonic Valve: The pulmonic valve was grossly normal. Pulmonic valve regurgitation is trivial. Aorta: The aortic root is normal in size and structure. Venous: The inferior vena cava is dilated in size with less than 50% respiratory variability, suggesting right atrial pressure of 15 mmHg. IAS/Shunts: No atrial level shunt detected by color flow Doppler.  LEFT VENTRICLE PLAX 2D LVIDd:         4.04 cm  Diastology LVIDs:         2.96 cm  LV e' medial:    4.78 cm/s LV PW:         1.18 cm  LV E/e' medial:  16.8 LV IVS:        1.19 cm  LV e' lateral:   7.14 cm/s LVOT diam:     2.10 cm  LV E/e' lateral: 11.2 LVOT Area:     3.46 cm  RIGHT VENTRICLE RV S prime:     16.00 cm/s TAPSE (M-mode): 2.6 cm LEFT ATRIUM             Index       RIGHT ATRIUM           Index LA diam:        3.50 cm 1.95 cm/m  RA Area:     14.70 cm LA Vol (A2C):   53.8 ml 29.90 ml/m RA Volume:   45.10 ml  25.07 ml/m LA Vol (A4C):   55.7 ml 30.96 ml/m LA Biplane Vol: 56.4 ml 31.35 ml/m  AORTIC VALVE AI PHT:      574 msec  AORTA Ao Root diam: 2.80 cm MITRAL VALVE MV  Area (PHT): 2.99 cm    SHUNTS MV Decel Time: 254 msec    Systemic Diam: 2.10 cm MV E velocity: 80.10 cm/s MV A velocity: 87.80 cm/s MV E/A ratio:  0.91 Rozann Lesches MD Electronically signed by Rozann Lesches MD Signature Date/Time: 04/29/2020/11:39:37 AM    Final     Orson Eva, DO  [facility]  If 7PM-7AM, please contact night-coverage [email or web address] Password [email or web address] 05/02/2020, 1:34 PM   LOS: 4 days

## 2020-05-02 NOTE — Progress Notes (Signed)
Dr McDowell's rounding note reviewed, admitted with GI bleed and new diagnosis of afib. Has been on dilt gtt, no anticoag due to bleeding. On dilt gtt at 5mg /hr. Tele shows she is back in SR. We will start oral dilt 60mg  tid with hold parameters, wean dilt gtt hopefully to off today   Carlyle Dolly MD

## 2020-05-03 ENCOUNTER — Telehealth: Payer: Self-pay | Admitting: Nurse Practitioner

## 2020-05-03 DIAGNOSIS — K626 Ulcer of anus and rectum: Secondary | ICD-10-CM

## 2020-05-03 DIAGNOSIS — K5289 Other specified noninfective gastroenteritis and colitis: Secondary | ICD-10-CM | POA: Diagnosis not present

## 2020-05-03 DIAGNOSIS — Z7189 Other specified counseling: Secondary | ICD-10-CM | POA: Diagnosis not present

## 2020-05-03 DIAGNOSIS — I4891 Unspecified atrial fibrillation: Secondary | ICD-10-CM | POA: Diagnosis not present

## 2020-05-03 LAB — CBC
HCT: 39.5 % (ref 36.0–46.0)
Hemoglobin: 12.8 g/dL (ref 12.0–15.0)
MCH: 29.6 pg (ref 26.0–34.0)
MCHC: 32.4 g/dL (ref 30.0–36.0)
MCV: 91.4 fL (ref 80.0–100.0)
Platelets: 273 10*3/uL (ref 150–400)
RBC: 4.32 MIL/uL (ref 3.87–5.11)
RDW: 14.6 % (ref 11.5–15.5)
WBC: 5.3 10*3/uL (ref 4.0–10.5)
nRBC: 0 % (ref 0.0–0.2)

## 2020-05-03 LAB — BASIC METABOLIC PANEL
Anion gap: 9 (ref 5–15)
BUN: 9 mg/dL (ref 8–23)
CO2: 26 mmol/L (ref 22–32)
Calcium: 8 mg/dL — ABNORMAL LOW (ref 8.9–10.3)
Chloride: 101 mmol/L (ref 98–111)
Creatinine, Ser: 0.54 mg/dL (ref 0.44–1.00)
GFR, Estimated: >60 mL/min (ref >60)
Glucose, Bld: 121 mg/dL — ABNORMAL HIGH (ref 70–99)
Potassium: 4 mmol/L (ref 3.5–5.1)
Sodium: 136 mmol/L (ref 135–145)

## 2020-05-03 LAB — MAGNESIUM: Magnesium: 2 mg/dL (ref 1.7–2.4)

## 2020-05-03 MED ORDER — DILTIAZEM HCL ER COATED BEADS 180 MG PO CP24
180.0000 mg | ORAL_CAPSULE | Freq: Every day | ORAL | Status: DC
  Administered 2020-05-03 – 2020-05-04 (×2): 180 mg via ORAL
  Filled 2020-05-03 (×2): qty 1

## 2020-05-03 NOTE — Progress Notes (Signed)
PROGRESS NOTE  ROKIA BOSKET JQZ:[id number] DOB: September 07, 1927 DOA: 04/28/2020 PCP: Rory Percy, MD   Brief History: 86 y.o.femalewith medical history significant forhypertension.Patient has been having multiple continuous watery stools since yesterday. Yesterday and today patient had small amounts ofblood in stool. No abdominal pain. Patient was weak, today and unable to ambulate. Patient has a history of chronic constipation.Patient was seen at [facility] ER yesterday and diagnosed with UTI but not so not given any medications. Daughter was concerned about stool C. difficile and brought patient to the ED here. Patient and daughter deny history of stroke, no cardiac history. No history of irregular heart rhythm. Denies history of frequent falls, daughter reports 2 falls over the past few years which are mechanical falls. Patient never smoked cigarettes, denies difficulty breathing or cough. No chest pain. No lower extremity swelling, no recent trips. Denies Dysuria or lower abdominal pain.  ED Course:heart rateinitially in the 90s increased to 159.O2 sats initially greater than 92% on room air, after procedure dropped tomid 80s placed on 3 L nasal cannula.EKG showing atrial fibrillation with RVR. Magnesium 2.2.WBC 11.9. Large stool in the rectal vault concerning for stercoral colitis. Fecal disimpaction attempted in the EDlargelyunsuccessful,with small amount of blood in stool. Cardizem drip started. 500 mill bolus given. Hospitalist admit for atrial fibrillation with RVR, and stercoral colitis and rectal bleeding. Patient transitioned to po diltiazem and Hgb monitored.    Assessment/Plan: Paroxysmal atrial fibrillationwith RVR -CHA2DS2-VASc score is 5 -back in sinus -discontinue IV diltiazem>>po diltiazem -04/29/20 Echo--EF 55-60%, no WMA -not on AC due to GIB--family also reluctant  -TSH 1.143  Hematochezia/GI Bleed -appreciate GI  consult/follow up -given bowel prep 11/16 -05/02/20--had 2 BMs with hematochezia, but less volume than 11/16 -05/02/20-- Hgb drop 13.3>>11.6>>12.8 --having intermittent low volume hematochezia -continue cathartics--colace and miralax--want patient to have loose stools -further eval per GI  Hypertension -continue IV diltiazem>>po diltiazem -not on any agents prior to hospitalization  Stercoral colitis -presenting with diarrhea likely postobstructive. Colitis likely from fecal impaction.Fecal disimpaction attempted in the ED  unsuccessful. -initiallystartedIV ceftriaxone and metronidazole--stopped 11/15 -continue cathartics as discussed above to avoid repeat impaction  Acute respiratory Failure with hypoxia -due to atelectatic disease -initially 87% RA -placed on 2L -weaned off oxygen now -IS -11/18-personally reviewed CXR--no edema or infiltrates  Chronic constipation with fecal impaction - -unsuccessfuldisimpactionin ED, appreciate GI recs for an appropriate bowel regimen -needs daily colace and miralax -avoid rectal tube -aim for loose stools   Cdiff Colonization -clinical presentation NOT consistent with active toxigenic Cdiff infection--no leukocytosis, no abd pain, no diarrhea at time of presentation) -had positive test at [facility]--04/27/20  Goals of Care -palliative consulted -patient now DNR          Status is: Inpatient  Remains inpatient appropriate because:IV treatments appropriate due to intensity of illness or inability to take PO   Dispo: The patient is from:Home Anticipated d/c is [id number] Anticipated d/c date is:11/20 Patient currently is not medically stable to d/c.        Family Communication:Daughter updatedat bedside11/19  Consultants:GI  Code Status: DNR  DVT Prophylaxis: SCDs   Procedures: As Listed in Progress Note  Above  Antibiotics: Ceftriaxone 11/14>>11/15 azithro11/14>>11/15    Subjective: Patient is pleasantly confused.  Patient denies fevers, chills, headache, chest pain, dyspnea, nausea, vomiting, diarrhea, abdominal pain, dysuria,   Objective: Vitals:   05/03/20 0800 05/03/20 0900 05/03/20 1000 05/03/20 1129  BP: (!) 178/79 (!) 174/72 Marland Kitchen)  172/75   Pulse: 87 90 84 86  Resp: 12 (!) 22 (!) 23 (!) 23  Temp:    97.9 F (36.6 C)  TempSrc:    Oral  SpO2: 99% 97% 92% 94%  Weight:      Height:        Intake/Output Summary (Last 24 hours) at 05/03/2020 1139 Last data filed at 05/03/2020 0328 Gross per 24 hour  Intake 0 ml  Output 800 ml  Net -800 ml   Weight change:  Exam:   General:  Pt is alert, follows commands appropriately, not in acute distress  HEENT: No icterus, No thrush, No neck mass, [place]/AT  Cardiovascular: RRR, S1/S2, no rubs, no gallops  Respiratory: bibasilar crackles. No wheeze  Abdomen: Soft/+BS, non tender, non distended, no guarding  Extremities: No edema, No lymphangitis, No petechiae, No rashes, no synovitis   Data Reviewed: I have personally reviewed following labs and imaging studies Basic Metabolic Panel: Recent Labs  Lab 04/28/20 1749 04/28/20 1749 04/29/20 0810 04/30/20 0538 05/01/20 0526 05/02/20 0613 05/03/20 0451  NA 135   < > 135 133* 135 134* 136  K 4.2   < > 4.1 4.4 4.2 3.7 4.0  CL 103   < > 103 99 101 101 101  CO2 23   < > 21* 25 22 26 26   GLUCOSE 126*   < > 90 104* 101* 155* 121*  BUN 24*   < > 20 15 11 8 9   CREATININE 0.93   < > 0.76 0.78 0.68 0.52 0.54  CALCIUM 8.1*   < > 8.1* 7.9* 8.0* 7.7* 8.0*  MG 2.2  --   --  2.1 2.1 1.8 2.0   < > = values in this interval not displayed.   Liver Function Tests: Recent Labs  Lab 04/28/20 1749  AST 28  ALT 21  ALKPHOS 70  BILITOT 1.0  PROT 6.4*  ALBUMIN 3.1*   Recent Labs  Lab 04/28/20 1749  LIPASE 19   No results for input(s): AMMONIA in the last 168  hours. Coagulation Profile: No results for input(s): INR, PROTIME in the last 168 hours. CBC: Recent Labs  Lab 04/28/20 1749 04/29/20 0316 04/29/20 1105 04/29/20 1105 04/30/20 0538 04/30/20 1006 05/01/20 0526 05/01/20 0934 05/02/20 0613 05/02/20 1712 05/03/20 0451  WBC 11.9*   < > 8.8  --  8.5  --  7.4  --  5.0  --  5.3  NEUTROABS 10.0*  --  7.1  --   --   --   --   --   --   --   --   HGB 14.0   < > 12.5   < > 12.8   < > 13.5 13.3 11.6* 12.9 12.8  HCT 44.2   < > 39.0   < > 41.3   < > 42.4 42.3 36.5 39.6 39.5  MCV 93.8   < > 92.6  --  93.2  --  92.8  --  91.0  --  91.4  PLT 248   < > 220  --  247  --  253  --  243  --  273   < > = values in this interval not displayed.   Cardiac Enzymes: No results for input(s): CKTOTAL, CKMB, CKMBINDEX, TROPONINI in the last 168 hours. BNP: Invalid input(s): POCBNP CBG: Recent Labs  Lab 04/30/20 1147  GLUCAP 174*   HbA1C: No results for input(s): HGBA1C in the last 72 hours. Urine analysis:  Component Value Date/Time   COLORURINE YELLOW 11/06/2013 1045   APPEARANCEUR CLEAR 11/06/2013 1045   LABSPEC <1.005 (L) 11/06/2013 1045   PHURINE 6.0 11/06/2013 1045   GLUCOSEU NEGATIVE 11/06/2013 1045   HGBUR TRACE (A) 11/06/2013 1045   BILIRUBINUR NEGATIVE 11/06/2013 1045   KETONESUR NEGATIVE 11/06/2013 1045   PROTEINUR NEGATIVE 11/06/2013 1045   UROBILINOGEN 0.2 11/06/2013 1045   NITRITE NEGATIVE 11/06/2013 1045   LEUKOCYTESUR NEGATIVE 11/06/2013 1045   Sepsis Labs: @LABRCNTIP (procalcitonin:4,lacticidven:4) ) Recent Results (from the past 240 hour(s))  Respiratory Panel by RT PCR (Flu A&B, Covid) -     Status: None   Collection Time: 04/28/20  4:23 PM  Result Value Ref Range Status   SARS Coronavirus 2 by RT PCR NEGATIVE NEGATIVE Final    Comment: (NOTE) SARS-CoV-2 target nucleic acids are NOT DETECTED.  The SARS-CoV-2 RNA is generally detectable in upper respiratoy specimens during the acute phase of infection. The  lowest concentration of SARS-CoV-2 viral copies this assay can detect is 131 copies/mL. A negative result does not preclude SARS-Cov-2 infection and should not be used as the sole basis for treatment or other patient management decisions. A negative result may occur with  improper specimen collection/handling, submission of specimen other than nasopharyngeal swab, presence of viral mutation(s) within the areas targeted by this assay, and inadequate number of viral copies (<131 copies/mL). A negative result must be combined with clinical observations, patient history, and epidemiological information. The expected result is Negative.  Fact Sheet for Patients:  PinkCheek.be  Fact Sheet for Healthcare Providers:  GravelBags.it  This test is no t yet approved or cleared by the [place] FDA and  has been authorized for detection and/or diagnosis of SARS-CoV-2 by FDA under an Emergency Use Authorization (EUA). This EUA will remain  in effect (meaning this test can be used) for the duration of the COVID-19 declaration under Section 564(b)(1) of the Act, 21 U.S.C. section 360bbb-3(b)(1), unless the authorization is terminated or revoked sooner.     Influenza A by PCR NEGATIVE NEGATIVE Final   Influenza B by PCR NEGATIVE NEGATIVE Final    Comment: (NOTE) The Xpert Xpress SARS-CoV-2/FLU/RSV assay is intended as an aid in  the diagnosis of influenza from Nasopharyngeal swab specimens and  should not be used as a sole basis for treatment. Nasal washings and  aspirates are unacceptable for Xpert Xpress SARS-CoV-2/FLU/RSV  testing.  Fact Sheet for Patients: PinkCheek.be  Fact Sheet for Healthcare Providers: GravelBags.it  This test is not yet approved or cleared by the [place] FDA and  has been authorized for detection and/or diagnosis of SARS-CoV-2 by  FDA under  an Emergency Use Authorization (EUA). This EUA will remain  in effect (meaning this test can be used) for the duration of the  Covid-19 declaration under Section 564(b)(1) of the Act, 21  U.S.C. section 360bbb-3(b)(1), unless the authorization is  terminated or revoked. Performed at [facility], [address], [place], [place] [address]   MRSA PCR Screening     Status: None   Collection Time: 04/29/20  2:21 PM   Specimen: Nasal Mucosa; Nasopharyngeal  Result Value Ref Range Status   MRSA by PCR NEGATIVE NEGATIVE Final    Comment:        The GeneXpert MRSA Assay (FDA approved for NASAL specimens only), is one component of a comprehensive MRSA colonization surveillance program. It is not intended to diagnose MRSA infection nor to guide or monitor treatment for MRSA infections. Performed at [facility]  [facility], [address], [place], [place] [address]      Scheduled Meds: . Chlorhexidine Gluconate Cloth  6 each Topical Daily  . diltiazem  180 mg Oral Daily  . docusate sodium  100 mg Oral BID  . feeding supplement  1 Container Oral TID BM  . polyethylene glycol  17 g Oral BID   Continuous Infusions:  Procedures/Studies: DG Chest 1 View  Result Date: 04/28/2020 CLINICAL DATA:  AFib EXAM: CHEST  1 VIEW COMPARISON:  April 27, 2020 FINDINGS: The heart size is stable. There is a trace right-sided pleural effusion. There is a persistent airspace opacity at the left lung base. The heart size is borderline enlarged. Aortic calcifications are noted. There is no pneumothorax. There is mild vascular congestion without overt pulmonary edema. IMPRESSION: Trace right-sided pleural effusion and bibasilar atelectasis. Electronically Signed   By: Constance Holster M.D.   On: 04/28/2020 23:30   CT ABDOMEN PELVIS W CONTRAST  Result Date: 04/28/2020 CLINICAL DATA:  86 year old female with concern for acute diverticulitis. EXAM: CT ABDOMEN AND PELVIS WITH CONTRAST TECHNIQUE: Multidetector CT  imaging of the abdomen and pelvis was performed using the standard protocol following bolus administration of intravenous contrast. CONTRAST:  15mL OMNIPAQUE IOHEXOL 300 MG/ML  SOLN COMPARISON:  CT abdomen pelvis dated 11/06/2013. FINDINGS: Lower chest: There are bibasilar dependent atelectasis. There is a cluster of density in the left lower lobe which may represent atelectasis or infiltrate. Clinical correlation and follow-up to resolution recommended. No intra-abdominal free air or free fluid. Hepatobiliary: No focal liver abnormality is seen. No gallstones, gallbladder wall thickening, or biliary dilatation. Pancreas: Unremarkable. No pancreatic ductal dilatation or surrounding inflammatory changes. Spleen: Normal in size without focal abnormality. Adrenals/Urinary Tract: The adrenal glands unremarkable. There is no hydronephrosis on either side. There is symmetric enhancement and excretion of contrast by both kidneys. The visualized ureters and urinary bladder appear unremarkable. Stomach/Bowel: There is large stool in the rectal vault. There is inflammatory changes of the perirectal fat concerning for stercoral colitis. Clinical correlation is recommended. There is no bowel obstruction. The appendix is normal. Vascular/Lymphatic: Advanced aortoiliac atherosclerotic disease. The IVC is unremarkable. No portal venous gas. There is no adenopathy. Reproductive: There is a 1 cm anterior fundal calcified fibroid. The ovaries are unremarkable. Other: Stranding and inflammatory changes of the presacral fat likely related to inflammatory/infectious process involving the rectal vault. No drainable fluid collection. Musculoskeletal: Osteopenia with extensive degenerative changes of the spine. Bilateral L5 pars defects with grade 1 L5-S1 anterolisthesis. Old-appearing compression fracture of the superior endplate of L1 with approximately 50% loss of vertebral body height. No acute osseous pathology. IMPRESSION: Large  stool in the rectal vault with findings concerning for stercoral colitis. Clinical correlation is recommended. No bowel obstruction. Normal appendix. Electronically Signed   By: Anner Crete M.D.   On: 04/28/2020 19:05   DG CHEST PORT 1 VIEW  Result Date: 05/02/2020 CLINICAL DATA:  Shortness of breath, hypoxia EXAM: PORTABLE CHEST 1 VIEW COMPARISON:  04/28/2020 FINDINGS: Heart is upper limits normal in size. No confluent airspace opacities, effusions or edema. No acute bony abnormality. IMPRESSION: No active disease. Electronically Signed   By: Rolm Baptise M.D.   On: 05/02/2020 17:50   ECHOCARDIOGRAM COMPLETE  Result Date: 04/29/2020    ECHOCARDIOGRAM REPORT   Patient Name:   MAHAM QUINTIN Date of Exam: 04/29/2020 Medical Rec #:  [id number]      Height:       65.0 in Accession #:  [id number]     Weight:       160.0 lb Date of Birth:  04-Jan-1928      BSA:          1.799 m Patient Age:    39 years       BP:           119/32 mmHg Patient Gender: F              HR:           86 bpm. Exam Location:  [facility] Procedure: 2D Echo Indications:    Atrial Fibrillation 427.31 / I48.91  History:        Patient has no prior history of Echocardiogram examinations.                 Arrythmias:Atrial Fibrillation; Risk Factors:Hypertension and                 Non-Smoker.  Sonographer:    Leavy Cella RDCS (AE) Referring Phys: [place]  1. Left ventricular ejection fraction, by estimation, is 55 to 60%. The left ventricle has normal function. The left ventricle has no regional wall motion abnormalities. There is mild left ventricular hypertrophy. Left ventricular diastolic parameters are indeterminate.  2. Right ventricular systolic function is normal. The right ventricular size is normal. Tricuspid regurgitation signal is inadequate for assessing PA pressure.  3. The mitral valve is grossly normal. Trivial mitral valve regurgitation.  4. The aortic valve is tricuspid. Aortic valve  regurgitation is mild. Aortic regurgitation PHT measures 574 msec.  5. The inferior vena cava is dilated in size with <50% respiratory variability, suggesting right atrial pressure of 15 mmHg.  6. Telemetry not recorded during study. FINDINGS  Left Ventricle: Left ventricular ejection fraction, by estimation, is 55 to 60%. The left ventricle has normal function. The left ventricle has no regional wall motion abnormalities. The left ventricular internal cavity size was normal in size. There is  mild left ventricular hypertrophy. Left ventricular diastolic parameters are indeterminate. Right Ventricle: The right ventricular size is normal. No increase in right ventricular wall thickness. Right ventricular systolic function is normal. Tricuspid regurgitation signal is inadequate for assessing PA pressure. Left Atrium: Left atrial size was normal in size. Right Atrium: Right atrial size was normal in size. Pericardium: There is no evidence of pericardial effusion. Mitral Valve: The mitral valve is grossly normal. Mild mitral annular calcification. Trivial mitral valve regurgitation. Tricuspid Valve: The tricuspid valve is grossly normal. Tricuspid valve regurgitation is trivial. Aortic Valve: The aortic valve is tricuspid. There is mild aortic valve annular calcification. Aortic valve regurgitation is mild. Aortic regurgitation PHT measures 574 msec. Pulmonic Valve: The pulmonic valve was grossly normal. Pulmonic valve regurgitation is trivial. Aorta: The aortic root is normal in size and structure. Venous: The inferior vena cava is dilated in size with less than 50% respiratory variability, suggesting right atrial pressure of 15 mmHg. IAS/Shunts: No atrial level shunt detected by color flow Doppler.  LEFT VENTRICLE PLAX 2D LVIDd:         4.04 cm  Diastology LVIDs:         2.96 cm  LV e' medial:    4.78 cm/s LV PW:         1.18 cm  LV E/e' medial:  16.8 LV IVS:        1.19 cm  LV e' lateral:   7.14 cm/s LVOT diam:      2.10  cm  LV E/e' lateral: 11.2 LVOT Area:     3.46 cm  RIGHT VENTRICLE RV S prime:     16.00 cm/s TAPSE (M-mode): 2.6 cm LEFT ATRIUM             Index       RIGHT ATRIUM           Index LA diam:        3.50 cm 1.95 cm/m  RA Area:     14.70 cm LA Vol (A2C):   53.8 ml 29.90 ml/m RA Volume:   45.10 ml  25.07 ml/m LA Vol (A4C):   55.7 ml 30.96 ml/m LA Biplane Vol: 56.4 ml 31.35 ml/m  AORTIC VALVE AI PHT:      574 msec  AORTA Ao Root diam: 2.80 cm MITRAL VALVE MV Area (PHT): 2.99 cm    SHUNTS MV Decel Time: 254 msec    Systemic Diam: 2.10 cm MV E velocity: 80.10 cm/s MV A velocity: 87.80 cm/s MV E/A ratio:  0.91 Rozann Lesches MD Electronically signed by Rozann Lesches MD Signature Date/Time: 04/29/2020/11:39:37 AM    Final     Orson Eva, DO  [facility]  If 7PM-7AM, please contact night-coverage [email or web address] Password [email or web address] 05/03/2020, 11:39 AM   LOS: 5 days

## 2020-05-03 NOTE — Progress Notes (Signed)
Admitted with GI bleed and new diagnosis of afib, initially on dilt gtt. Yesterday started on oral dilt 60mg  tid and weaned off dilt gtt. BP's are tolerating oral dilt and actually are elevated. Telemetry reviewed, shows maintianing SR  No anticoag given GI bleed, also family reluctant in general for anticoag. Could reassess down the road as outpatient.   We will consolidate her diltiazem to long acting 180mg  daily  We will sign off inpatient care, please call with questions.   Carlyle Dolly MD

## 2020-05-03 NOTE — Progress Notes (Signed)
Physical Therapy Treatment Patient Details Name: Latoya Long MRN: [id number] DOB: October 03, 1927 Today's Date: 05/03/2020    History of Present Illness Latoya Long is a 86 y.o. female with medical history significant for hypertension.  Patient has been having multiple continuous watery stools since yesterday.  Yesterday and today patient had small amounts of blood in stool.  No abdominal pain.  Patient was weak, today and unable to ambulate.  Patient has a history of chronic constipation.  Patient was seen at [facility] ER yesterday and diagnosed with UTI but not so not given any medications.  Daughter was concerned about stool C. difficile and brought patient to the ED here. Patient and daughter deny history of stroke, no cardiac history.  No history of irregular heart rhythm.  Denies history of frequent falls, daughter reports 2 falls over the past few years which are mechanical falls. Patient never smoked cigarettes, denies difficulty breathing or cough.  No chest pain.  No lower extremity swelling, no recent trips. Denies Dysuria or lower abdominal pain.    PT Comments    Patient demonstrates fair/poor carryover for using BUE during side lying to sitting due to weakness, demonstrates fair/good return for completing BLE ROM/strengthening exercises with verbal cues and frequent demonstration, limited to a few slow labored unsteady steps at bedside due to fatigue, poor standing balance and generalized weakness.  Patient tolerated sitting up in chair after therapy with her daughter present in room.  Patient will benefit from continued physical therapy in hospital and recommended venue below to increase strength, balance, endurance for safe ADLs and gait.    Follow Up Recommendations  SNF     Equipment Recommendations  None recommended by PT    Recommendations for Other Services       Precautions / Restrictions Precautions Precautions: Fall Restrictions Weight Bearing Restrictions: No     Mobility  Bed Mobility Overal bed mobility: Needs Assistance Bed Mobility: Supine to Sit     Supine to sit: Mod assist     General bed mobility comments: increased time, labored movement  Transfers Overall transfer level: Needs assistance Equipment used: Rolling walker (2 wheeled) Transfers: Sit to/from [facility] Sit to Stand: Mod assist Stand pivot transfers: Mod assist       General transfer comment: very unsteady on feet with frequent buckling of knees  Ambulation/Gait Ambulation/Gait assistance: Mod assist;Max assist Gait Distance (Feet): 5 Feet Assistive device: Rolling walker (2 wheeled) Gait Pattern/deviations: Decreased step length - left;Decreased step length - right;Decreased stride length Gait velocity: decreased   General Gait Details: limited to 5-6 slow unsteady labored steps with difficulty making turns due to weaknes, poor standing balance   Stairs             Wheelchair Mobility    Modified Rankin (Stroke Patients Only)       Balance Overall balance assessment: Needs assistance Sitting-balance support: Feet supported;No upper extremity supported Sitting balance-Leahy Scale: Fair Sitting balance - Comments: fair/good seated at EOB   Standing balance support: During functional activity;Bilateral upper extremity supported Standing balance-Leahy Scale: Poor Standing balance comment: fair/poor using RW                            Cognition Arousal/Alertness: Awake/alert Behavior During Therapy: WFL for tasks assessed/performed Overall Cognitive Status: Within Functional Limits for tasks assessed  Exercises General Exercises - Lower Extremity Long Arc Quad: Seated;AROM;Strengthening;Both;10 reps Hip Flexion/Marching: Seated;Strengthening;Both;10 reps;AAROM Toe Raises: Seated;AROM;Strengthening;Both;10 reps Heel Raises: Seated;AROM;Strengthening;Both;10  reps    General Comments        Pertinent Vitals/Pain Pain Assessment: Faces Faces Pain Scale: Hurts little more Pain Location: over bottom due to frequent diarrhea Pain Descriptors / Indicators: Sore;Discomfort Pain Intervention(s): Limited activity within patient's tolerance;Monitored during session;Repositioned;Other (comment) (put pillow on seat of chair)    Home Living                      Prior Function            PT Goals (current goals can now be found in the care plan section) Acute Rehab PT Goals Patient Stated Goal: return home with family to assist PT Goal Formulation: With patient Time For Goal Achievement: 05/14/20 Potential to Achieve Goals: Good Progress towards PT goals: Progressing toward goals    Frequency    Min 3X/week      PT Plan Current plan remains appropriate    Co-evaluation              AM-PAC PT "6 Clicks" Mobility   Outcome Measure  Help needed turning from your back to your side while in a flat bed without using bedrails?: A Lot Help needed moving from lying on your back to sitting on the side of a flat bed without using bedrails?: A Lot Help needed moving to and from a bed to a chair (including a wheelchair)?: A Lot Help needed standing up from a chair using your arms (e.g., wheelchair or bedside chair)?: A Lot Help needed to walk in hospital room?: A Lot Help needed climbing 3-5 steps with a railing? : Total 6 Click Score: 11    End of Session   Activity Tolerance: Patient tolerated treatment well;Patient limited by fatigue Patient left: in chair;with call bell/phone within reach;with family/visitor present Nurse Communication: Mobility status PT Visit Diagnosis: Unsteadiness on feet (R26.81);Other abnormalities of gait and mobility (R26.89);Muscle weakness (generalized) (M62.81)     Time: 0093-8182 PT Time Calculation (min) (ACUTE ONLY): 28 min  Charges:  $Therapeutic Exercise: 8-22 mins $Therapeutic  Activity: 8-22 mins                     2:43 PM, 05/03/20 Lonell Grandchild, MPT Physical Therapist with [facility] 336 [phone] office [phone] mobile phone

## 2020-05-03 NOTE — Care Management Important Message (Signed)
Important Message  Patient Details  Name: Latoya Long MRN: [id number] Date of Birth: 28-May-1928   Medicare Important Message Given:  Yes     Tommy Medal 05/03/2020, 4:09 PM

## 2020-05-03 NOTE — Telephone Encounter (Signed)
Please schedule 6-8 week hospital follow-up.  Per daughter's request: please call patient home number [phone]) and daughter Deborah's cell [phone]) as she will be coordinating and transporting patient.

## 2020-05-03 NOTE — TOC Progression Note (Signed)
Transition of Care [facility]) - Progression Note   Patient Details  Name: Latoya Long MRN: [id number] Date of Birth: 1928/01/24  Transition of Care [facility]) CM/SW [place], LCSW Phone Number: 05/03/2020, 12:34 PM  Clinical Narrative: Daughter does not want patient to discharge to SNF as she will be in quarantine for 14 days. Daughter agreeable to patient discharging home with [facility] services. Daughter requested [facility] aide in addition to PT and RN. CSW spoke with [place] with [facility]. Per Vaughan Basta, [facility] does not have an aide at this time. Daughter updated. Patient to discharge with HHPT/RN. TOC to follow.  Expected Discharge Plan: [place] Barriers to Discharge: Continued Medical Work up  Expected Discharge Plan and Services Expected Discharge Plan: [place] arrangements for the past 2 months: [place] Arranged: RN, PT [facility] Agency: [place] ([facility]) Date [place]: 05/02/20 Time [place]: 1834 Representative spoke with at [place]: Romualdo Bolk  Readmission Risk Interventions No flowsheet data found.

## 2020-05-03 NOTE — Progress Notes (Signed)
Report given to [facility] on 300.

## 2020-05-03 NOTE — Progress Notes (Addendum)
Subjective: Feeling better today. Some persistent dripping of stool. No abdominal pain. Tolerating a diet (though not able to eat as much as normal). Denies N/V. No other GI complaints.  Daughter at bedside, discussed stercoral ulcer, reasons for overflow diarrhea and persistent dripping given large impaction. She is Chief Financial Officer and MiraLAX for her mom as outpatient. States they were told she would be going home likely tomorrow.  Objective: Vital signs in last 24 hours: Temp:  [97.8 F (36.6 C)-97.9 F (36.6 C)] 97.9 F (36.6 C) (11/19 1129) Pulse Rate:  [66-90] 86 (11/19 1129) Resp:  [12-32] 23 (11/19 1129) BP: (120-179)/(47-79) 172/75 (11/19 1000) SpO2:  [88 %-99 %] 94 % (11/19 1129) Last BM Date: 05/03/20 General:   Alert and oriented, pleasant Head:  Normocephalic and atraumatic. Eyes:  No icterus, sclera clear. Conjuctiva pink.  Heart:  S1, S2 present, no murmurs noted.  Lungs: Clear to auscultation bilaterally, without wheezing, rales, or rhonchi.  Abdomen:  Bowel sounds present, rounded but soft, non-tender, non-distended. No HSM or hernias noted. No rebound or guarding. Msk:  Symmetrical without gross deformities. Pulses:  Normal bilateral DP pulses noted. Extremities:  Without clubbing or edema. Neurologic:  Alert and  oriented x4;  grossly normal neurologically. Psych:  Alert and cooperative. Normal mood and affect.  Intake/Output from previous day: 11/18 0701 - 11/19 0700 In: 170.7 [I.V.:170.7] Out: 800 [Urine:800] Intake/Output this shift: No intake/output data recorded.  Lab Results: Recent Labs    05/01/20 0526 05/01/20 0934 05/02/20 0613 05/02/20 1712 05/03/20 0451  WBC 7.4  --  5.0  --  5.3  HGB 13.5   < > 11.6* 12.9 12.8  HCT 42.4   < > 36.5 39.6 39.5  PLT 253  --  243  --  273   < > = values in this interval not displayed.   BMET Recent Labs    05/01/20 0526 05/02/20 0613 05/03/20 0451  NA 135 134* 136  K 4.2 3.7 4.0  CL 101 101  101  CO2 22 26 26   GLUCOSE 101* 155* 121*  BUN 11 8 9   CREATININE 0.68 0.52 0.54  CALCIUM 8.0* 7.7* 8.0*   LFT No results for input(s): PROT, ALBUMIN, AST, ALT, ALKPHOS, BILITOT, BILIDIR, IBILI in the last 72 hours. PT/INR No results for input(s): LABPROT, INR in the last 72 hours. Hepatitis Panel No results for input(s): HEPBSAG, HCVAB, HEPAIGM, HEPBIGM in the last 72 hours.   Studies/Results: DG CHEST PORT 1 VIEW  Result Date: 05/02/2020 CLINICAL DATA:  Shortness of breath, hypoxia EXAM: PORTABLE CHEST 1 VIEW COMPARISON:  04/28/2020 FINDINGS: Heart is upper limits normal in size. No confluent airspace opacities, effusions or edema. No acute bony abnormality. IMPRESSION: No active disease. Electronically Signed   By: Rolm Baptise M.D.   On: 05/02/2020 17:50    Assessment: 86 year old female with reports of watery diarrhea and rectal bleeding presenting to the ED November 14 with findings on CT of large stool in the rectal vault concerning for stercoral colitis.  Diarrhea appear to be more of an overflow incontinence in the setting however at [facility] on November 13 she had a positive C. difficile toxin.  ID did not feel she had active Cdiff.   She underwent manual disimpaction in the ED with a significant amount of stool removed and evidence of low-volume bleeding at the time of rectal exam.  C. difficile testing at our facility was not successful in light of formed bowel movement.  Repeat  disimpaction November 15.  She received bowel prep night of the 16th. Has had several significant bowel movements and DRE yesterday by Dr. Gala Romney with gelatinous material/blood in the rectal vault, no further impaction.   Patient continues to have oozing of soft liquid stool but with only blood tinge (per nursing: brown stools with some blood). No plans for endoscopic evaluation unless significant bleeding or hemoglobin drops. Yesterday her hemoglobin was down to 11.6 but has since rebounded to  12.8. Patient's daughter in agreement to monitor for persistent bleeding or further drop in H/H.  Per nursing, does have rectal area irritation with some bleeding (which could explain some of the blood on the stool); daughter purchasing barrier ointment to help.  Hospital course complicated by newly recognized A. fib/flutter with RVR. Troponin I elevated at 272.   Plan: 1. Monitor hgb while in the hospital 2. Notify of any significant/large volume bleeding 3. Continue Colace and MiraLAX 4. Planned outpatient bowel regimen: Colace 100 mg bid as well as MiraLAX 1-2 times daily as needed 5. Plan for outpatient GI follow-up 6. We will sign off for now, will remove consult order. Please contact us and enter a new consult order if we can be of further assistance.   Thank you for allowing [place] to participate in the care of [name], DNP, AGNP-C Adult & Gerontological Nurse Practitioner [facility]     LOS: 5 days    05/03/2020, 2:45 PM

## 2020-05-04 DIAGNOSIS — K626 Ulcer of anus and rectum: Secondary | ICD-10-CM | POA: Diagnosis not present

## 2020-05-04 DIAGNOSIS — I4891 Unspecified atrial fibrillation: Secondary | ICD-10-CM | POA: Diagnosis not present

## 2020-05-04 DIAGNOSIS — Z7189 Other specified counseling: Secondary | ICD-10-CM | POA: Diagnosis not present

## 2020-05-04 DIAGNOSIS — K625 Hemorrhage of anus and rectum: Secondary | ICD-10-CM | POA: Diagnosis not present

## 2020-05-04 LAB — CBC
HCT: 42.2 % (ref 36.0–46.0)
Hemoglobin: 13.4 g/dL (ref 12.0–15.0)
MCH: 29.2 pg (ref 26.0–34.0)
MCHC: 31.8 g/dL (ref 30.0–36.0)
MCV: 91.9 fL (ref 80.0–100.0)
Platelets: 311 10*3/uL (ref 150–400)
RBC: 4.59 MIL/uL (ref 3.87–5.11)
RDW: 14.7 % (ref 11.5–15.5)
WBC: 6 10*3/uL (ref 4.0–10.5)
nRBC: 0 % (ref 0.0–0.2)

## 2020-05-04 LAB — BASIC METABOLIC PANEL
Anion gap: 9 (ref 5–15)
BUN: 10 mg/dL (ref 8–23)
CO2: 26 mmol/L (ref 22–32)
Calcium: 8.2 mg/dL — ABNORMAL LOW (ref 8.9–10.3)
Chloride: 101 mmol/L (ref 98–111)
Creatinine, Ser: 0.65 mg/dL (ref 0.44–1.00)
GFR, Estimated: >60 mL/min (ref >60)
Glucose, Bld: 119 mg/dL — ABNORMAL HIGH (ref 70–99)
Potassium: 4.1 mmol/L (ref 3.5–5.1)
Sodium: 136 mmol/L (ref 135–145)

## 2020-05-04 LAB — MAGNESIUM: Magnesium: 2.1 mg/dL (ref 1.7–2.4)

## 2020-05-04 MED ORDER — DILTIAZEM HCL ER COATED BEADS 180 MG PO CP24
180.0000 mg | ORAL_CAPSULE | Freq: Every day | ORAL | 1 refills | Status: DC
Start: 2020-05-05 — End: 2023-08-22

## 2020-05-04 MED ORDER — POLYETHYLENE GLYCOL 3350 17 G PO PACK
17.0000 g | PACK | Freq: Two times a day (BID) | ORAL | 0 refills | Status: DC
Start: 2020-05-04 — End: 2023-08-22

## 2020-05-04 MED ORDER — DOCUSATE SODIUM 100 MG PO CAPS
100.0000 mg | ORAL_CAPSULE | Freq: Two times a day (BID) | ORAL | 0 refills | Status: DC
Start: 2020-05-04 — End: 2023-08-22

## 2020-05-04 MED ORDER — ASPIRIN EC 81 MG PO TBEC: 81.0000 mg | DELAYED_RELEASE_TABLET | Freq: Every day | ORAL | 11 refills | Status: DC

## 2020-05-04 NOTE — Plan of Care (Signed)

## 2020-05-04 NOTE — TOC Transition Note (Signed)
Transition of Care [facility]) - CM/SW Discharge Note   Patient Details  Name: MIEL WISENER MRN: [id number] Date of Birth: 09/07/1927  Transition of Care [facility]) CM/SW Contact:  Natasha Bence, LCSW Phone Number: 05/04/2020, 1:53 PM   Clinical Narrative:    CSW received discharge order. CSW notified Corene Cornea with [facility] of discharge. Corene Cornea agreeable to begin services upon discharge. TO signing off.   Final next level of care: [place] Barriers to Discharge: Barriers Resolved   Patient Goals and CMS Choice Patient states their goals for this hospitalization and ongoing recovery are:: Return home with [facility]   Choice offered to / list presented to : Patient  Discharge Placement                    Patient and family notified of of transfer: 05/04/20  Discharge Plan and Services                          HH Arranged: RN, PT [facility] Agency: [place] ([place]) Date [place]: 05/04/20 Time [place]: [place] Representative spoke with at [place]: [place] ([place]) Interventions     Readmission Risk Interventions No flowsheet data found.

## 2020-05-04 NOTE — Discharge Summary (Addendum)
Physician Discharge Summary  Latoya Long DJS:[id number] DOB: Mar 02, 1928 DOA: 04/28/2020  PCP: Rory Percy, MD  Admit date: 04/28/2020 Discharge date: 05/04/2020  Admitted From: Home Disposition:  Home   Recommendations for Outpatient Follow-up:  1. Follow up with PCP in 1-2 weeks 2. Please obtain BMP/CBC in one week   Home Health: YES Equipment/Devices: HHPT and RN  Discharge Condition: Stable CODE STATUS: DNR Diet recommendation: Heart Healthy    Brief/Interim Summary: 86 y.o.femalewith medical history significant forhypertension.Patient has been having multiple continuous watery stools since yesterday. Yesterday and today patient had small amounts ofblood in stool. No abdominal pain. Patient was weak, today and unable to ambulate. Patient has a history of chronic constipation.Patient was seen at [facility] ER yesterday and diagnosed with UTI but not so not given any medications. Daughter was concerned about stool C. difficile and brought patient to the ED here. Patient and daughter deny history of stroke, no cardiac history. No history of irregular heart rhythm. Denies history of frequent falls, daughter reports 2 falls over the past few years which are mechanical falls. Patient never smoked cigarettes, denies difficulty breathing or cough. No chest pain. No lower extremity swelling, no recent trips. Denies Dysuria or lower abdominal pain.  ED Course:heart rateinitially in the 90s increased to 159.O2 sats initially greater than 92% on room air, after procedure dropped tomid 80s placed on 3 L nasal cannula.EKG showing atrial fibrillation with RVR. Magnesium 2.2.WBC 11.9. Large stool in the rectal vault concerning for stercoral colitis. Fecal disimpaction attempted in the EDlargelyunsuccessful,with small amount of blood in stool. Cardizem drip started. 500 mill bolus given. Hospitalist admit for atrial fibrillation with RVR, and stercoral colitis and  rectal bleeding. Patient transitioned to po diltiazem and Hgb monitored.    Discharge Diagnoses:  Paroxysmal atrial fibrillationwith RVR -CHA2DS2-VASc score is 5 -back insinus -discontinue IV diltiazem>>po diltiazem -04/29/20 Echo--EF 55-60%, no WMA -not on AC due to GIB--family also reluctant  -TSH 1.143 -CHADS-VASc = 4 -start ASA 81 mg after d/c  Hematochezia/GI Bleed -appreciate GI consult/follow up -given bowel prep11/16 -05/02/20--had2BMs with hematochezia, but less volume than 11/16 -05/02/20-- Hgb drop 13.3>>11.6>>12.8>>13.4 --having intermittent low volume hematochezia -continue cathartics--colace and miralax--want patient to have loose stools -Hgb stablized and rising again at time of d/c; no further hematochezia  Hypertension -continue IV diltiazem>>po diltiazem -not on any agents prior to hospitalization  Stercoral colitis -presenting with diarrhea likely postobstructive. Colitis likely from fecal impaction.Fecal disimpaction attempted in the ED  unsuccessful. -initiallystartedIV ceftriaxone and metronidazole--stopped 11/15 -continue cathartics as discussed above to avoid repeat impaction  Acute respiratory Failure with hypoxia -due to atelectatic disease -initially 87% RA -placed on 2L -weanedoff oxygen now -IS -11/18-personally reviewed CXR--no edema or infiltrates  Chronic constipation with fecal impaction - -unsuccessfuldisimpactionin ED, appreciate GI recs for an appropriate bowel regimen -needs daily colace and miralax -avoid rectal tube -aim for loose stools   Cdiff Colonization -clinical presentation NOT consistent with active toxigenic Cdiff infection--no leukocytosis, no abd pain, no diarrhea at time of presentation) -had positive test at [facility]--04/27/20  Urinary Retention -foley placed in ED -foley catheter removed-->patient was able to urinate spontaneously  Goals of Care -palliative consulted -patient now  DNR  Discharge Instructions   Allergies as of 05/04/2020   No Known Allergies     Medication List    TAKE these medications   diltiazem 180 MG 24 hr capsule Commonly known as: CARDIZEM CD Take 1 capsule (180 mg total) by mouth daily. Start taking on: May 05, 2020   docusate sodium 100 MG capsule Commonly known as: COLACE Take 1 capsule (100 mg total) by mouth 2 (two) times daily.   polyethylene glycol 17 g packet Commonly known as: MIRALAX / GLYCOLAX Take 17 g by mouth 2 (two) times daily.       Follow-up Information    [facility] Follow [facility].   Specialty: Home Health Services Why: PT and RN             No Known Allergies  Consultations:  GI   Procedures/Studies: DG Chest 1 View  Result Date: 04/28/2020 CLINICAL DATA:  AFib EXAM: CHEST  1 VIEW COMPARISON:  April 27, 2020 FINDINGS: The heart size is stable. There is a trace right-sided pleural effusion. There is a persistent airspace opacity at the left lung base. The heart size is borderline enlarged. Aortic calcifications are noted. There is no pneumothorax. There is mild vascular congestion without overt pulmonary edema. IMPRESSION: Trace right-sided pleural effusion and bibasilar atelectasis. Electronically Signed   By: Constance Holster M.D.   On: 04/28/2020 23:30   CT ABDOMEN PELVIS W CONTRAST  Result Date: 04/28/2020 CLINICAL DATA:  86 year old female with concern for acute diverticulitis. EXAM: CT ABDOMEN AND PELVIS WITH CONTRAST TECHNIQUE: Multidetector CT imaging of the abdomen and pelvis was performed using the standard protocol following bolus administration of intravenous contrast. CONTRAST:  179mL OMNIPAQUE IOHEXOL 300 MG/ML  SOLN COMPARISON:  CT abdomen pelvis dated 11/06/2013. FINDINGS: Lower chest: There are bibasilar dependent atelectasis. There is a cluster of density in the left lower lobe which may represent atelectasis or infiltrate. Clinical correlation and  follow-up to resolution recommended. No intra-abdominal free air or free fluid. Hepatobiliary: No focal liver abnormality is seen. No gallstones, gallbladder wall thickening, or biliary dilatation. Pancreas: Unremarkable. No pancreatic ductal dilatation or surrounding inflammatory changes. Spleen: Normal in size without focal abnormality. Adrenals/Urinary Tract: The adrenal glands unremarkable. There is no hydronephrosis on either side. There is symmetric enhancement and excretion of contrast by both kidneys. The visualized ureters and urinary bladder appear unremarkable. Stomach/Bowel: There is large stool in the rectal vault. There is inflammatory changes of the perirectal fat concerning for stercoral colitis. Clinical correlation is recommended. There is no bowel obstruction. The appendix is normal. Vascular/Lymphatic: Advanced aortoiliac atherosclerotic disease. The IVC is unremarkable. No portal venous gas. There is no adenopathy. Reproductive: There is a 1 cm anterior fundal calcified fibroid. The ovaries are unremarkable. Other: Stranding and inflammatory changes of the presacral fat likely related to inflammatory/infectious process involving the rectal vault. No drainable fluid collection. Musculoskeletal: Osteopenia with extensive degenerative changes of the spine. Bilateral L5 pars defects with grade 1 L5-S1 anterolisthesis. Old-appearing compression fracture of the superior endplate of L1 with approximately 50% loss of vertebral body height. No acute osseous pathology. IMPRESSION: Large stool in the rectal vault with findings concerning for stercoral colitis. Clinical correlation is recommended. No bowel obstruction. Normal appendix. Electronically Signed   By: Anner Crete M.D.   On: 04/28/2020 19:05   DG CHEST PORT 1 VIEW  Result Date: 05/02/2020 CLINICAL DATA:  Shortness of breath, hypoxia EXAM: PORTABLE CHEST 1 VIEW COMPARISON:  04/28/2020 FINDINGS: Heart is upper limits normal in size. No  confluent airspace opacities, effusions or edema. No acute bony abnormality. IMPRESSION: No active disease. Electronically Signed   By: Rolm Baptise M.D.   On: 05/02/2020 17:50   ECHOCARDIOGRAM COMPLETE  Result Date: 04/29/2020    ECHOCARDIOGRAM REPORT   Patient Name:  Latoya Long Date of Exam: 04/29/2020 Medical Rec #:  [id number]      Height:       65.0 in Accession #:    [id number]     Weight:       160.0 lb Date of Birth:  Sep 21, 1927      BSA:          1.799 m Patient Age:    65 years       BP:           119/32 mmHg Patient Gender: F              HR:           86 bpm. Exam Location:  [facility] Procedure: 2D Echo Indications:    Atrial Fibrillation 427.31 / I48.91  History:        Patient has no prior history of Echocardiogram examinations.                 Arrythmias:Atrial Fibrillation; Risk Factors:Hypertension and                 Non-Smoker.  Sonographer:    Leavy Cella RDCS (AE) Referring Phys: [place]  1. Left ventricular ejection fraction, by estimation, is 55 to 60%. The left ventricle has normal function. The left ventricle has no regional wall motion abnormalities. There is mild left ventricular hypertrophy. Left ventricular diastolic parameters are indeterminate.  2. Right ventricular systolic function is normal. The right ventricular size is normal. Tricuspid regurgitation signal is inadequate for assessing PA pressure.  3. The mitral valve is grossly normal. Trivial mitral valve regurgitation.  4. The aortic valve is tricuspid. Aortic valve regurgitation is mild. Aortic regurgitation PHT measures 574 msec.  5. The inferior vena cava is dilated in size with <50% respiratory variability, suggesting right atrial pressure of 15 mmHg.  6. Telemetry not recorded during study. FINDINGS  Left Ventricle: Left ventricular ejection fraction, by estimation, is 55 to 60%. The left ventricle has normal function. The left ventricle has no regional wall motion abnormalities.  The left ventricular internal cavity size was normal in size. There is  mild left ventricular hypertrophy. Left ventricular diastolic parameters are indeterminate. Right Ventricle: The right ventricular size is normal. No increase in right ventricular wall thickness. Right ventricular systolic function is normal. Tricuspid regurgitation signal is inadequate for assessing PA pressure. Left Atrium: Left atrial size was normal in size. Right Atrium: Right atrial size was normal in size. Pericardium: There is no evidence of pericardial effusion. Mitral Valve: The mitral valve is grossly normal. Mild mitral annular calcification. Trivial mitral valve regurgitation. Tricuspid Valve: The tricuspid valve is grossly normal. Tricuspid valve regurgitation is trivial. Aortic Valve: The aortic valve is tricuspid. There is mild aortic valve annular calcification. Aortic valve regurgitation is mild. Aortic regurgitation PHT measures 574 msec. Pulmonic Valve: The pulmonic valve was grossly normal. Pulmonic valve regurgitation is trivial. Aorta: The aortic root is normal in size and structure. Venous: The inferior vena cava is dilated in size with less than 50% respiratory variability, suggesting right atrial pressure of 15 mmHg. IAS/Shunts: No atrial level shunt detected by color flow Doppler.  LEFT VENTRICLE PLAX 2D LVIDd:         4.04 cm  Diastology LVIDs:         2.96 cm  LV e' medial:    4.78 cm/s LV PW:         1.18 cm  LV  E/e' medial:  16.8 LV IVS:        1.19 cm  LV e' lateral:   7.14 cm/s LVOT diam:     2.10 cm  LV E/e' lateral: 11.2 LVOT Area:     3.46 cm  RIGHT VENTRICLE RV S prime:     16.00 cm/s TAPSE (M-mode): 2.6 cm LEFT ATRIUM             Index       RIGHT ATRIUM           Index LA diam:        3.50 cm 1.95 cm/m  RA Area:     14.70 cm LA Vol (A2C):   53.8 ml 29.90 ml/m RA Volume:   45.10 ml  25.07 ml/m LA Vol (A4C):   55.7 ml 30.96 ml/m LA Biplane Vol: 56.4 ml 31.35 ml/m  AORTIC VALVE AI PHT:      574 msec   AORTA Ao Root diam: 2.80 cm MITRAL VALVE MV Area (PHT): 2.99 cm    SHUNTS MV Decel Time: 254 msec    Systemic Diam: 2.10 cm MV E velocity: 80.10 cm/s MV A velocity: 87.80 cm/s MV E/A ratio:  0.91 Rozann Lesches MD Electronically signed by Rozann Lesches MD Signature Date/Time: 04/29/2020/11:39:37 AM    Final         Discharge Exam: Vitals:   05/04/20 0520 05/04/20 0900  BP: (!) 146/115 125/64  Pulse: (!) 45 84  Resp: 17 16  Temp: (!) 97 F (36.1 C)   SpO2: 95%    Vitals:   05/03/20 2228 05/04/20 0208 05/04/20 0520 05/04/20 0900  BP: (!) 154/81 (!) 157/67 (!) 146/115 125/64  Pulse: (!) 107 (!) 106 (!) 45 84  Resp: 18 16 17 16   Temp: 97.9 F (36.6 C) (!) 97.3 F (36.3 C) (!) 97 F (36.1 C)   TempSrc:      SpO2: 92% 94% 95%   Weight:      Height:        General: Pt is alert, awake, not in acute distress Cardiovascular: RRR, S1/S2 +, no rubs, no gallops Respiratory: bibasilar crackles. No wheeze Abdominal: Soft, NT, ND, bowel sounds + Extremities: no edema, no cyanosis   The results of significant diagnostics from this hospitalization (including imaging, microbiology, ancillary and laboratory) are listed below for reference.    Significant Diagnostic Studies: DG Chest 1 View  Result Date: 04/28/2020 CLINICAL DATA:  AFib EXAM: CHEST  1 VIEW COMPARISON:  April 27, 2020 FINDINGS: The heart size is stable. There is a trace right-sided pleural effusion. There is a persistent airspace opacity at the left lung base. The heart size is borderline enlarged. Aortic calcifications are noted. There is no pneumothorax. There is mild vascular congestion without overt pulmonary edema. IMPRESSION: Trace right-sided pleural effusion and bibasilar atelectasis. Electronically Signed   By: Constance Holster M.D.   On: 04/28/2020 23:30   CT ABDOMEN PELVIS W CONTRAST  Result Date: 04/28/2020 CLINICAL DATA:  86 year old female with concern for acute diverticulitis. EXAM: CT ABDOMEN AND  PELVIS WITH CONTRAST TECHNIQUE: Multidetector CT imaging of the abdomen and pelvis was performed using the standard protocol following bolus administration of intravenous contrast. CONTRAST:  158mL OMNIPAQUE IOHEXOL 300 MG/ML  SOLN COMPARISON:  CT abdomen pelvis dated 11/06/2013. FINDINGS: Lower chest: There are bibasilar dependent atelectasis. There is a cluster of density in the left lower lobe which may represent atelectasis or infiltrate. Clinical correlation and follow-up to resolution recommended. No  intra-abdominal free air or free fluid. Hepatobiliary: No focal liver abnormality is seen. No gallstones, gallbladder wall thickening, or biliary dilatation. Pancreas: Unremarkable. No pancreatic ductal dilatation or surrounding inflammatory changes. Spleen: Normal in size without focal abnormality. Adrenals/Urinary Tract: The adrenal glands unremarkable. There is no hydronephrosis on either side. There is symmetric enhancement and excretion of contrast by both kidneys. The visualized ureters and urinary bladder appear unremarkable. Stomach/Bowel: There is large stool in the rectal vault. There is inflammatory changes of the perirectal fat concerning for stercoral colitis. Clinical correlation is recommended. There is no bowel obstruction. The appendix is normal. Vascular/Lymphatic: Advanced aortoiliac atherosclerotic disease. The IVC is unremarkable. No portal venous gas. There is no adenopathy. Reproductive: There is a 1 cm anterior fundal calcified fibroid. The ovaries are unremarkable. Other: Stranding and inflammatory changes of the presacral fat likely related to inflammatory/infectious process involving the rectal vault. No drainable fluid collection. Musculoskeletal: Osteopenia with extensive degenerative changes of the spine. Bilateral L5 pars defects with grade 1 L5-S1 anterolisthesis. Old-appearing compression fracture of the superior endplate of L1 with approximately 50% loss of vertebral body height.  No acute osseous pathology. IMPRESSION: Large stool in the rectal vault with findings concerning for stercoral colitis. Clinical correlation is recommended. No bowel obstruction. Normal appendix. Electronically Signed   By: Anner Crete M.D.   On: 04/28/2020 19:05   DG CHEST PORT 1 VIEW  Result Date: 05/02/2020 CLINICAL DATA:  Shortness of breath, hypoxia EXAM: PORTABLE CHEST 1 VIEW COMPARISON:  04/28/2020 FINDINGS: Heart is upper limits normal in size. No confluent airspace opacities, effusions or edema. No acute bony abnormality. IMPRESSION: No active disease. Electronically Signed   By: Rolm Baptise M.D.   On: 05/02/2020 17:50   ECHOCARDIOGRAM COMPLETE  Result Date: 04/29/2020    ECHOCARDIOGRAM REPORT   Patient Name:   Latoya Long Date of Exam: 04/29/2020 Medical Rec #:  [id number]      Height:       65.0 in Accession #:    [id number]     Weight:       160.0 lb Date of Birth:  Jan 19, 1928      BSA:          1.799 m Patient Age:    23 years       BP:           119/32 mmHg Patient Gender: F              HR:           86 bpm. Exam Location:  [facility] Procedure: 2D Echo Indications:    Atrial Fibrillation 427.31 / I48.91  History:        Patient has no prior history of Echocardiogram examinations.                 Arrythmias:Atrial Fibrillation; Risk Factors:Hypertension and                 Non-Smoker.  Sonographer:    Leavy Cella RDCS (AE) Referring Phys: [employer]  1. Left ventricular ejection fraction, by estimation, is 55 to 60%. The left ventricle has normal function. The left ventricle has no regional wall motion abnormalities. There is mild left ventricular hypertrophy. Left ventricular diastolic parameters are indeterminate.  2. Right ventricular systolic function is normal. The right ventricular size is normal. Tricuspid regurgitation signal is inadequate for assessing PA pressure.  3. The mitral valve is grossly normal. Trivial mitral valve regurgitation.  4.  The aortic valve is tricuspid. Aortic valve regurgitation is mild. Aortic regurgitation PHT measures 574 msec.  5. The inferior vena cava is dilated in size with <50% respiratory variability, suggesting right atrial pressure of 15 mmHg.  6. Telemetry not recorded during study. FINDINGS  Left Ventricle: Left ventricular ejection fraction, by estimation, is 55 to 60%. The left ventricle has normal function. The left ventricle has no regional wall motion abnormalities. The left ventricular internal cavity size was normal in size. There is  mild left ventricular hypertrophy. Left ventricular diastolic parameters are indeterminate. Right Ventricle: The right ventricular size is normal. No increase in right ventricular wall thickness. Right ventricular systolic function is normal. Tricuspid regurgitation signal is inadequate for assessing PA pressure. Left Atrium: Left atrial size was normal in size. Right Atrium: Right atrial size was normal in size. Pericardium: There is no evidence of pericardial effusion. Mitral Valve: The mitral valve is grossly normal. Mild mitral annular calcification. Trivial mitral valve regurgitation. Tricuspid Valve: The tricuspid valve is grossly normal. Tricuspid valve regurgitation is trivial. Aortic Valve: The aortic valve is tricuspid. There is mild aortic valve annular calcification. Aortic valve regurgitation is mild. Aortic regurgitation PHT measures 574 msec. Pulmonic Valve: The pulmonic valve was grossly normal. Pulmonic valve regurgitation is trivial. Aorta: The aortic root is normal in size and structure. Venous: The inferior vena cava is dilated in size with less than 50% respiratory variability, suggesting right atrial pressure of 15 mmHg. IAS/Shunts: No atrial level shunt detected by color flow Doppler.  LEFT VENTRICLE PLAX 2D LVIDd:         4.04 cm  Diastology LVIDs:         2.96 cm  LV e' medial:    4.78 cm/s LV PW:         1.18 cm  LV E/e' medial:  16.8 LV IVS:        1.19 cm   LV e' lateral:   7.14 cm/s LVOT diam:     2.10 cm  LV E/e' lateral: 11.2 LVOT Area:     3.46 cm  RIGHT VENTRICLE RV S prime:     16.00 cm/s TAPSE (M-mode): 2.6 cm LEFT ATRIUM             Index       RIGHT ATRIUM           Index LA diam:        3.50 cm 1.95 cm/m  RA Area:     14.70 cm LA Vol (A2C):   53.8 ml 29.90 ml/m RA Volume:   45.10 ml  25.07 ml/m LA Vol (A4C):   55.7 ml 30.96 ml/m LA Biplane Vol: 56.4 ml 31.35 ml/m  AORTIC VALVE AI PHT:      574 msec  AORTA Ao Root diam: 2.80 cm MITRAL VALVE MV Area (PHT): 2.99 cm    SHUNTS MV Decel Time: 254 msec    Systemic Diam: 2.10 cm MV E velocity: 80.10 cm/s MV A velocity: 87.80 cm/s MV E/A ratio:  0.91 Rozann Lesches MD Electronically signed by Rozann Lesches MD Signature Date/Time: 04/29/2020/11:39:37 AM    Final      Microbiology: Recent Results (from the past 240 hour(s))  Respiratory Panel by RT PCR (Flu A&B, Covid) -     Status: None   Collection Time: 04/28/20  4:23 PM  Result Value Ref Range Status   SARS Coronavirus 2 by RT PCR NEGATIVE NEGATIVE Final    Comment: (NOTE) SARS-CoV-2  target nucleic acids are NOT DETECTED.  The SARS-CoV-2 RNA is generally detectable in upper respiratoy specimens during the acute phase of infection. The lowest concentration of SARS-CoV-2 viral copies this assay can detect is 131 copies/mL. A negative result does not preclude SARS-Cov-2 infection and should not be used as the sole basis for treatment or other patient management decisions. A negative result may occur with  improper specimen collection/handling, submission of specimen other than nasopharyngeal swab, presence of viral mutation(s) within the areas targeted by this assay, and inadequate number of viral copies (<131 copies/mL). A negative result must be combined with clinical observations, patient history, and epidemiological information. The expected result is Negative.  Fact Sheet for Patients:   PinkCheek.be  Fact Sheet for Healthcare Providers:  GravelBags.it  This test is no t yet approved or cleared by the [place] FDA and  has been authorized for detection and/or diagnosis of SARS-CoV-2 by FDA under an Emergency Use Authorization (EUA). This EUA will remain  in effect (meaning this test can be used) for the duration of the COVID-19 declaration under Section 564(b)(1) of the Act, 21 U.S.C. section 360bbb-3(b)(1), unless the authorization is terminated or revoked sooner.     Influenza A by PCR NEGATIVE NEGATIVE Final   Influenza B by PCR NEGATIVE NEGATIVE Final    Comment: (NOTE) The Xpert Xpress SARS-CoV-2/FLU/RSV assay is intended as an aid in  the diagnosis of influenza from Nasopharyngeal swab specimens and  should not be used as a sole basis for treatment. Nasal washings and  aspirates are unacceptable for Xpert Xpress SARS-CoV-2/FLU/RSV  testing.  Fact Sheet for Patients: PinkCheek.be  Fact Sheet for Healthcare Providers: GravelBags.it  This test is not yet approved or cleared by the [place] FDA and  has been authorized for detection and/or diagnosis of SARS-CoV-2 by  FDA under an Emergency Use Authorization (EUA). This EUA will remain  in effect (meaning this test can be used) for the duration of the  Covid-19 declaration under Section 564(b)(1) of the Act, 21  U.S.C. section 360bbb-3(b)(1), unless the authorization is  terminated or revoked. Performed at [facility], [address] [place], [place] [address]   MRSA PCR Screening     Status: None   Collection Time: 04/29/20  2:21 PM   Specimen: Nasal Mucosa; Nasopharyngeal  Result Value Ref Range Status   MRSA by PCR NEGATIVE NEGATIVE Final    Comment:        The GeneXpert MRSA Assay (FDA approved for NASAL specimens only), is one component of a comprehensive MRSA  colonization surveillance program. It is not intended to diagnose MRSA infection nor to guide or monitor treatment for MRSA infections. Performed at [facility], [address], [place], [place] [address]      Labs: Basic Metabolic Panel: Recent Labs  Lab 04/30/20 0538 04/30/20 0538 05/01/20 0526 05/01/20 0526 05/02/20 0613 05/02/20 0613 05/03/20 0451 05/04/20 0744  NA 133*  --  135  --  134*  --  136 136  K 4.4   < > 4.2   < > 3.7   < > 4.0 4.1  CL 99  --  101  --  101  --  101 101  CO2 25  --  22  --  26  --  26 26  GLUCOSE 104*  --  101*  --  155*  --  121* 119*  BUN 15  --  11  --  8  --  9 10  CREATININE 0.78  --  0.68  --  0.52  --  0.54 0.65  CALCIUM 7.9*  --  8.0*  --  7.7*  --  8.0* 8.2*  MG 2.1  --  2.1  --  1.8  --  2.0 2.1   < > = values in this interval not displayed.   Liver Function Tests: Recent Labs  Lab 04/28/20 1749  AST 28  ALT 21  ALKPHOS 70  BILITOT 1.0  PROT 6.4*  ALBUMIN 3.1*   Recent Labs  Lab 04/28/20 1749  LIPASE 19   No results for input(s): AMMONIA in the last 168 hours. CBC: Recent Labs  Lab 04/28/20 1749 04/29/20 0316 04/29/20 1105 04/29/20 1105 04/30/20 0538 04/30/20 1006 05/01/20 0526 05/01/20 0526 05/01/20 0934 05/02/20 0613 05/02/20 1712 05/03/20 0451 05/04/20 0744  WBC 11.9*   < > 8.8   < > 8.5  --  7.4  --   --  5.0  --  5.3 6.0  NEUTROABS 10.0*  --  7.1  --   --   --   --   --   --   --   --   --   --   HGB 14.0   < > 12.5   < > 12.8   < > 13.5   < > 13.3 11.6* 12.9 12.8 13.4  HCT 44.2   < > 39.0   < > 41.3   < > 42.4   < > 42.3 36.5 39.6 39.5 42.2  MCV 93.8   < > 92.6   < > 93.2  --  92.8  --   --  91.0  --  91.4 91.9  PLT 248   < > 220   < > 247  --  253  --   --  243  --  273 311   < > = values in this interval not displayed.   Cardiac Enzymes: No results for input(s): CKTOTAL, CKMB, CKMBINDEX, TROPONINI in the last 168 hours. BNP: Invalid input(s): POCBNP CBG: Recent Labs  Lab 04/30/20 1147   GLUCAP 174*    Time coordinating discharge:  36 minutes  Signed:  Orson Eva, DO [facility] Pager: [phone] 05/04/2020, 10:47 AM

## 2020-05-06 ENCOUNTER — Encounter: Payer: Self-pay | Admitting: Nurse Practitioner

## 2020-05-06 DIAGNOSIS — Z993 Dependence on wheelchair: Secondary | ICD-10-CM | POA: Diagnosis not present

## 2020-05-06 DIAGNOSIS — Z9181 History of falling: Secondary | ICD-10-CM | POA: Diagnosis not present

## 2020-05-06 DIAGNOSIS — J9601 Acute respiratory failure with hypoxia: Secondary | ICD-10-CM | POA: Diagnosis not present

## 2020-05-06 DIAGNOSIS — K649 Unspecified hemorrhoids: Secondary | ICD-10-CM | POA: Diagnosis not present

## 2020-05-06 DIAGNOSIS — S81811D Laceration without foreign body, right lower leg, subsequent encounter: Secondary | ICD-10-CM | POA: Diagnosis not present

## 2020-05-06 DIAGNOSIS — Z7982 Long term (current) use of aspirin: Secondary | ICD-10-CM | POA: Diagnosis not present

## 2020-05-06 DIAGNOSIS — N390 Urinary tract infection, site not specified: Secondary | ICD-10-CM | POA: Diagnosis not present

## 2020-05-06 DIAGNOSIS — K5289 Other specified noninfective gastroenteritis and colitis: Secondary | ICD-10-CM | POA: Diagnosis not present

## 2020-05-06 DIAGNOSIS — K5909 Other constipation: Secondary | ICD-10-CM | POA: Diagnosis not present

## 2020-05-06 DIAGNOSIS — I10 Essential (primary) hypertension: Secondary | ICD-10-CM | POA: Diagnosis not present

## 2020-05-06 DIAGNOSIS — I480 Paroxysmal atrial fibrillation: Secondary | ICD-10-CM | POA: Diagnosis not present

## 2020-05-06 DIAGNOSIS — K921 Melena: Secondary | ICD-10-CM | POA: Diagnosis not present

## 2020-05-06 DIAGNOSIS — R339 Retention of urine, unspecified: Secondary | ICD-10-CM | POA: Diagnosis not present

## 2020-05-06 NOTE — Telephone Encounter (Signed)
Patient scheduled and daughter called

## 2020-05-08 DIAGNOSIS — R300 Dysuria: Secondary | ICD-10-CM | POA: Diagnosis not present

## 2020-05-15 DIAGNOSIS — I10 Essential (primary) hypertension: Secondary | ICD-10-CM | POA: Diagnosis not present

## 2020-05-15 DIAGNOSIS — N390 Urinary tract infection, site not specified: Secondary | ICD-10-CM | POA: Diagnosis not present

## 2020-05-15 DIAGNOSIS — Z7982 Long term (current) use of aspirin: Secondary | ICD-10-CM | POA: Diagnosis not present

## 2020-05-15 DIAGNOSIS — K5289 Other specified noninfective gastroenteritis and colitis: Secondary | ICD-10-CM | POA: Diagnosis not present

## 2020-05-15 DIAGNOSIS — Z9181 History of falling: Secondary | ICD-10-CM | POA: Diagnosis not present

## 2020-05-15 DIAGNOSIS — K5909 Other constipation: Secondary | ICD-10-CM | POA: Diagnosis not present

## 2020-05-15 DIAGNOSIS — Z993 Dependence on wheelchair: Secondary | ICD-10-CM | POA: Diagnosis not present

## 2020-05-15 DIAGNOSIS — I480 Paroxysmal atrial fibrillation: Secondary | ICD-10-CM | POA: Diagnosis not present

## 2020-05-15 DIAGNOSIS — K649 Unspecified hemorrhoids: Secondary | ICD-10-CM | POA: Diagnosis not present

## 2020-05-15 DIAGNOSIS — R339 Retention of urine, unspecified: Secondary | ICD-10-CM | POA: Diagnosis not present

## 2020-05-15 DIAGNOSIS — K921 Melena: Secondary | ICD-10-CM | POA: Diagnosis not present

## 2020-05-15 DIAGNOSIS — J9601 Acute respiratory failure with hypoxia: Secondary | ICD-10-CM | POA: Diagnosis not present

## 2020-05-15 DIAGNOSIS — S81811D Laceration without foreign body, right lower leg, subsequent encounter: Secondary | ICD-10-CM | POA: Diagnosis not present

## 2020-05-16 ENCOUNTER — Encounter: Payer: Self-pay | Admitting: Nurse Practitioner

## 2020-06-03 DIAGNOSIS — E860 Dehydration: Secondary | ICD-10-CM | POA: Diagnosis not present

## 2020-06-03 DIAGNOSIS — I482 Chronic atrial fibrillation, unspecified: Secondary | ICD-10-CM | POA: Diagnosis not present

## 2020-06-25 ENCOUNTER — Ambulatory Visit: Payer: PPO | Admitting: Nurse Practitioner

## 2020-07-11 ENCOUNTER — Ambulatory Visit: Payer: PPO | Admitting: Nurse Practitioner

## 2020-08-14 DIAGNOSIS — J069 Acute upper respiratory infection, unspecified: Secondary | ICD-10-CM | POA: Diagnosis not present

## 2020-08-14 DIAGNOSIS — J309 Allergic rhinitis, unspecified: Secondary | ICD-10-CM | POA: Diagnosis not present

## 2020-11-09 DIAGNOSIS — R300 Dysuria: Secondary | ICD-10-CM | POA: Diagnosis not present

## 2021-02-05 DIAGNOSIS — J0101 Acute recurrent maxillary sinusitis: Secondary | ICD-10-CM | POA: Diagnosis not present

## 2021-02-05 DIAGNOSIS — G20 Parkinson's disease: Secondary | ICD-10-CM | POA: Diagnosis not present

## 2021-03-24 DIAGNOSIS — J329 Chronic sinusitis, unspecified: Secondary | ICD-10-CM | POA: Diagnosis not present

## 2021-03-24 DIAGNOSIS — J40 Bronchitis, not specified as acute or chronic: Secondary | ICD-10-CM | POA: Diagnosis not present

## 2023-08-22 ENCOUNTER — Emergency Department (HOSPITAL_COMMUNITY)
Admission: EM | Admit: 2023-08-22 | Discharge: 2023-08-22 | Disposition: A | Discharge status: Home / Self Care | Attending: Emergency Medicine | Admitting: Emergency Medicine

## 2023-08-22 ENCOUNTER — Encounter (HOSPITAL_COMMUNITY): Payer: Self-pay | Admitting: Emergency Medicine

## 2023-08-22 ENCOUNTER — Emergency Department (HOSPITAL_COMMUNITY)

## 2023-08-22 ENCOUNTER — Other Ambulatory Visit: Payer: Self-pay

## 2023-08-22 DIAGNOSIS — G9389 Other specified disorders of brain: Secondary | ICD-10-CM | POA: Diagnosis not present

## 2023-08-22 DIAGNOSIS — W06XXXA Fall from bed, initial encounter: Secondary | ICD-10-CM | POA: Insufficient documentation

## 2023-08-22 DIAGNOSIS — S060X0A Concussion without loss of consciousness, initial encounter: Secondary | ICD-10-CM | POA: Diagnosis not present

## 2023-08-22 DIAGNOSIS — S0101XA Laceration without foreign body of scalp, initial encounter: Secondary | ICD-10-CM | POA: Insufficient documentation

## 2023-08-22 DIAGNOSIS — S0990XA Unspecified injury of head, initial encounter: Secondary | ICD-10-CM | POA: Diagnosis present

## 2023-08-22 DIAGNOSIS — I10 Essential (primary) hypertension: Secondary | ICD-10-CM | POA: Diagnosis not present

## 2023-08-22 DIAGNOSIS — Z23 Encounter for immunization: Secondary | ICD-10-CM | POA: Insufficient documentation

## 2023-08-22 MED ORDER — ACETAMINOPHEN 500 MG PO TABS
1000.0000 mg | ORAL_TABLET | Freq: Once | ORAL | Status: DC
  Filled 2023-08-22: qty 2

## 2023-08-22 MED ORDER — TETANUS-DIPHTH-ACELL PERTUSSIS 5-2.5-18.5 LF-MCG/0.5 IM SUSY
0.5000 mL | PREFILLED_SYRINGE | Freq: Once | INTRAMUSCULAR | Status: AC
  Administered 2023-08-22: 0.5 mL via INTRAMUSCULAR
  Filled 2023-08-22: qty 0.5

## 2023-08-22 MED ORDER — LIDOCAINE-EPINEPHRINE-TETRACAINE (LET) TOPICAL GEL
3.0000 mL | Freq: Once | TOPICAL | Status: AC
  Administered 2023-08-22: 3 mL via TOPICAL
  Filled 2023-08-22: qty 3

## 2023-08-22 MED ORDER — IBUPROFEN 400 MG PO TABS
400.0000 mg | ORAL_TABLET | Freq: Once | ORAL | Status: AC
  Administered 2023-08-22: 400 mg via ORAL
  Filled 2023-08-22: qty 1

## 2023-08-22 NOTE — ED Notes (Signed)
 Applied non stick bandage to back of head and wrapped with gauze then cohesive bandage. Gave pt a few extra products for home.

## 2023-08-22 NOTE — ED Notes (Signed)
 Patient transported to CT

## 2023-08-22 NOTE — ED Notes (Signed)
 ED Provider at bedside.

## 2023-08-22 NOTE — ED Provider Notes (Signed)
 [place] EMERGENCY DEPARTMENT AT [facility] Provider Note   CSN: [id number] Arrival date & time: 08/22/23  0544     History  Chief Complaint  Patient presents with   Latoya Long    Latoya Long is a 89 y.o. female.  The history is provided by the patient and a relative.  Fall This is a new problem. The current episode started 1 to 2 hours ago. Associated symptoms include headaches.  Patient with history of hypertension but is no longer on medication presents after mechanical fall.  Patient was attempting get out of bed and fell back hitting her head on a sofa.  No LOC or vomiting.  No change in mental status.  She is not anticoagulated.  Patient lives with daughter and son-in-law.  They noted that she had a laceration to her scalp. Patient reports headache, but no other acute complaints    Past Medical History:  Diagnosis Date   Hemorrhoids    Hypertension     Home Medications Prior to Admission medications   Not on File      Allergies    Patient has no known allergies.    Review of Systems   Review of Systems  Skin:  Positive for wound.  Neurological:  Positive for headaches.    Physical Exam Updated Vital Signs BP (!) 192/86   Pulse 86   Temp 97.6 F (36.4 C) (Oral)   Resp 19   Ht 1.651 m (5\' 5" )   Wt 79.2 kg   SpO2 96%   BMI 29.06 kg/m  Physical Exam CONSTITUTIONAL: Elderly, no acute distress HEAD: Laceration to posterior scalp, bleeding controlled, no signs of trauma EYES: EOMI/PERRL ENMT: Mucous membranes moist SPINE/BACK:entire spine nontender No bruising/crepitance/stepoffs noted to spine CV: S1/S2 noted, no murmurs/rubs/gallops noted LUNGS: Lungs are clear to auscultation bilaterally, no apparent distress ABDOMEN: soft, nontender NEURO: Pt is awake/alert/appropriate, moves all extremitiesx4.  No facial droop.  Tremor noted EXTREMITIES: pulses normal/equal, full ROM Pelvis stable All other extremities/joints palpated/ranged and  nontender SKIN: warm, color normal  ED Results / Procedures / Treatments   Labs (all labs ordered are listed, but only abnormal results are displayed) Labs Reviewed - No data to display  EKG None  Radiology CT Head Wo Contrast Result Date: 08/22/2023 CLINICAL DATA:  89 year old female status post fall from bed. Laceration and hematoma to the scalp. EXAM: CT HEAD WITHOUT CONTRAST TECHNIQUE: Contiguous axial images were obtained from the base of the skull through the vertex without intravenous contrast. RADIATION DOSE REDUCTION: This exam was performed according to the departmental dose-optimization program which includes automated exposure control, adjustment of the mA and/or kV according to patient size and/or use of iterative reconstruction technique. COMPARISON:  Head CT 06/13/2015. FINDINGS: Brain: Chronic cerebral volume loss, mild ex vacuo appearing ventricular enlargement. No midline shift, mass effect, or evidence of intracranial mass lesion. No acute intracranial hemorrhage identified. Chronic but progressed, Patchy and confluent bilateral periventricular white matter hypodensity. No cortically based acute infarct identified. Vascular: Calcified atherosclerosis at the skull base. No suspicious intracranial vascular hyperdensity. Skull: No acute osseous abnormality identified. Sinuses/Orbits: Visualized paranasal sinuses and mastoids are stable and well aerated. Other: Right posterior convexity broad-based scalp soft tissue injury with hematoma and gas compatible with laceration. Underlying calvarium appears stable and intact. Stable and negative orbits soft tissues. IMPRESSION: 1. Right posterior convexity scalp hematoma and laceration. No skull fracture. 2. No acute intracranial abnormality. Chronic cerebral volume loss, progressed chronic cerebral white matter changes  since 2016. Electronically Signed   By: Odessa Fleming M.D.   On: 08/22/2023 06:45    Procedures .Laceration Repair  Date/Time:  08/22/2023 6:30 AM  Performed by: Zadie Rhine, MD Authorized by: Zadie Rhine, MD   Consent:    Consent obtained:  Verbal   Consent given by:  Patient Universal protocol:    Patient identity confirmed:  Provided demographic data Anesthesia:    Anesthesia method:  Topical application   Topical anesthetic:  LET Laceration details:    Location:  Scalp   Scalp location:  Occipital   Length (cm):  4 Exploration:    Wound exploration: wound explored through full range of motion and entire depth of wound visualized     Contaminated: no   Skin repair:    Repair method:  Staples   Number of staples:  6 Approximation:    Approximation:  Loose Repair type:    Repair type:  Simple Post-procedure details:    Procedure completion:  Tolerated well, no immediate complications Comments:     Patient with horizontal laceration to the posterior scalp.  Wound was cleaned and examined, no foreign bodies and hair was removed six staples were placed without difficulty.  Bleeding controlled.     Medications Ordered in ED Medications  Tdap (BOOSTRIX) injection 0.5 mL (0.5 mLs Intramuscular Given 08/22/23 0615)  lidocaine-EPINEPHrine-tetracaine (LET) topical gel (3 mLs Topical Given 08/22/23 1610)    ED Course/ Medical Decision Making/ A&P                                 Medical Decision Making Amount and/or Complexity of Data Reviewed Radiology: ordered.  Risk Prescription drug management.   This patient presents to the ED for concern of head injury, this involves an extensive number of treatment options, and is a complaint that carries with it a high risk of complications and morbidity.  The differential diagnosis includes but is not limited to subdural hematoma, subarachnoid hemorrhage, skull fracture, concussion   Comorbidities that complicate the patient evaluation: Patient's presentation is complicated by their history of hypertension  Social Determinants of Health: Patient's   frailty and poor mobility   increases the complexity of managing their presentation  Additional history obtained: Additional history obtained from family Records reviewed previous admission documents  Imaging Studies ordered: I ordered imaging studies including CT scan head   I independently visualized and interpreted imaging which showed no acute findings I agree with the radiologist interpretation   Medicines ordered and prescription drug management: I ordered medication including tetanus for laceration  Reevaluation: After the interventions noted above, I reevaluated the patient and found that they have :improved  Complexity of problems addressed: Patient's presentation is most consistent with  acute presentation with potential threat to life or bodily function  Disposition: After consideration of the diagnostic results and the patient's response to treatment,  I feel that the patent would benefit from discharge   .    Patient awake alert in no acute distress.  CT head negative.  No other signs of acute traumatic injury. Wound was repaired with staples, will need follow-up in a week to remove at PCP office       Final Clinical Impression(s) / ED Diagnoses Final diagnoses:  Injury of head, initial encounter  Concussion without loss of consciousness, initial encounter  Laceration of scalp, initial encounter    Rx / DC Orders ED Discharge Orders  None         Zadie Rhine, MD 08/22/23 [phone]

## 2023-08-22 NOTE — ED Triage Notes (Signed)
 Pt here for fall out of bed and hitting her head. Denies LOC. Pt with laceration and hematoma to posterior scalp.

## 2023-08-30 DIAGNOSIS — S0101XA Laceration without foreign body of scalp, initial encounter: Secondary | ICD-10-CM | POA: Diagnosis not present

## 2023-11-18 ENCOUNTER — Emergency Department (HOSPITAL_COMMUNITY)

## 2023-11-18 ENCOUNTER — Other Ambulatory Visit: Payer: Self-pay

## 2023-11-18 ENCOUNTER — Encounter (HOSPITAL_COMMUNITY): Payer: Self-pay

## 2023-11-18 ENCOUNTER — Emergency Department (HOSPITAL_COMMUNITY)
Admission: EM | Admit: 2023-11-18 | Discharge: 2023-11-19 | Disposition: A | Discharge status: Home / Self Care | Attending: Emergency Medicine | Admitting: Emergency Medicine

## 2023-11-18 DIAGNOSIS — R1032 Left lower quadrant pain: Secondary | ICD-10-CM | POA: Diagnosis not present

## 2023-11-18 DIAGNOSIS — R531 Weakness: Secondary | ICD-10-CM | POA: Diagnosis not present

## 2023-11-18 DIAGNOSIS — D259 Leiomyoma of uterus, unspecified: Secondary | ICD-10-CM | POA: Diagnosis not present

## 2023-11-18 DIAGNOSIS — D72829 Elevated white blood cell count, unspecified: Secondary | ICD-10-CM | POA: Diagnosis not present

## 2023-11-18 DIAGNOSIS — R000 Tachycardia, unspecified: Secondary | ICD-10-CM | POA: Insufficient documentation

## 2023-11-18 DIAGNOSIS — I10 Essential (primary) hypertension: Secondary | ICD-10-CM | POA: Insufficient documentation

## 2023-11-18 DIAGNOSIS — K5641 Fecal impaction: Secondary | ICD-10-CM | POA: Insufficient documentation

## 2023-11-18 DIAGNOSIS — R509 Fever, unspecified: Secondary | ICD-10-CM | POA: Insufficient documentation

## 2023-11-18 DIAGNOSIS — I700 Atherosclerosis of aorta: Secondary | ICD-10-CM | POA: Insufficient documentation

## 2023-11-18 DIAGNOSIS — E860 Dehydration: Secondary | ICD-10-CM | POA: Insufficient documentation

## 2023-11-18 LAB — COMPREHENSIVE METABOLIC PANEL WITH GFR
ALT: 8 U/L (ref 0–44)
AST: 19 U/L (ref 15–41)
Albumin: 3.6 g/dL (ref 3.5–5.0)
Alkaline Phosphatase: 111 U/L (ref 38–126)
Anion gap: 11 (ref 5–15)
BUN: 22 mg/dL (ref 8–23)
CO2: 24 mmol/L (ref 22–32)
Calcium: 8.5 mg/dL — ABNORMAL LOW (ref 8.9–10.3)
Chloride: 103 mmol/L (ref 98–111)
Creatinine, Ser: 0.82 mg/dL (ref 0.44–1.00)
GFR, Estimated: >60 mL/min (ref >60)
Glucose, Bld: 147 mg/dL — ABNORMAL HIGH (ref 70–99)
Potassium: 4.1 mmol/L (ref 3.5–5.1)
Sodium: 138 mmol/L (ref 135–145)
Total Bilirubin: 1.1 mg/dL (ref 0.0–1.2)
Total Protein: 7.1 g/dL (ref 6.5–8.1)

## 2023-11-18 LAB — CBC WITH DIFFERENTIAL/PLATELET
Abs Immature Granulocytes: 0.07 10*3/uL (ref 0.00–0.07)
Basophils Absolute: 0 10*3/uL (ref 0.0–0.1)
Basophils Relative: 0 %
Eosinophils Absolute: 0 10*3/uL (ref 0.0–0.5)
Eosinophils Relative: 0 %
HCT: 51.3 % — ABNORMAL HIGH (ref 36.0–46.0)
Hemoglobin: 16.9 g/dL — ABNORMAL HIGH (ref 12.0–15.0)
Immature Granulocytes: 1 %
Lymphocytes Relative: 9 %
Lymphs Abs: 1.2 10*3/uL (ref 0.7–4.0)
MCH: 30.3 pg (ref 26.0–34.0)
MCHC: 32.9 g/dL (ref 30.0–36.0)
MCV: 92.1 fL (ref 80.0–100.0)
Monocytes Absolute: 0.7 10*3/uL (ref 0.1–1.0)
Monocytes Relative: 5 %
Neutro Abs: 10.6 10*3/uL — ABNORMAL HIGH (ref 1.7–7.7)
Neutrophils Relative %: 85 %
Platelets: 273 10*3/uL (ref 150–400)
RBC: 5.57 MIL/uL — ABNORMAL HIGH (ref 3.87–5.11)
RDW: 14.4 % (ref 11.5–15.5)
WBC: 12.6 10*3/uL — ABNORMAL HIGH (ref 4.0–10.5)
nRBC: 0 % (ref 0.0–0.2)

## 2023-11-18 LAB — LACTIC ACID, PLASMA: Lactic Acid, Venous: 1.5 mmol/L (ref 0.5–1.9)

## 2023-11-18 LAB — LIPASE, BLOOD: Lipase: 24 U/L (ref 11–51)

## 2023-11-18 MED ORDER — IOHEXOL 300 MG/ML  SOLN
100.0000 mL | Freq: Once | INTRAMUSCULAR | Status: AC | PRN
  Administered 2023-11-18: 100 mL via INTRAVENOUS

## 2023-11-18 MED ORDER — ACETAMINOPHEN 10 MG/ML IV SOLN
1000.0000 mg | Freq: Once | INTRAVENOUS | Status: AC
  Administered 2023-11-18: 1000 mg via INTRAVENOUS

## 2023-11-18 MED ORDER — ACETAMINOPHEN 500 MG PO TABS
1000.0000 mg | ORAL_TABLET | Freq: Once | ORAL | Status: DC
  Filled 2023-11-18: qty 2

## 2023-11-18 MED ORDER — ACETAMINOPHEN 10 MG/ML IV SOLN
1000.0000 mg | Freq: Four times a day (QID) | INTRAVENOUS | Status: DC
  Filled 2023-11-18: qty 100

## 2023-11-18 MED ORDER — PIPERACILLIN-TAZOBACTAM 3.375 G IVPB 30 MIN
3.3750 g | Freq: Once | INTRAVENOUS | Status: AC
  Administered 2023-11-18: 3.375 g via INTRAVENOUS
  Filled 2023-11-18: qty 50

## 2023-11-18 MED ORDER — SODIUM CHLORIDE 0.9 % IV BOLUS
1000.0000 mL | Freq: Once | INTRAVENOUS | Status: AC
  Administered 2023-11-18: 1000 mL via INTRAVENOUS

## 2023-11-18 MED ORDER — ACETAMINOPHEN 10 MG/ML IV SOLN: 1000.0000 mg | Freq: Four times a day (QID) | INTRAVENOUS | Status: DC

## 2023-11-18 NOTE — ED Triage Notes (Signed)
 Pt daughter reports:  Diarrhea Since 8am Given iodium  No help Weakness Started today Not eating today Fear of more loose stool

## 2023-11-18 NOTE — ED Provider Notes (Signed)
 11:33 PM Assumed care from Dr. Isaiah Marc, please see their note for full history, physical and decision making until this point. In brief this is a 89 y.o. year old female who presented to the ED tonight with Diarrhea and Weakness     Impacted. Now disimpacted. Had fever. Pending urine.   Urine clean. Patient appears well, vitals stable and WNL. Discussed with daughter and feels she is near baseline but needs help with all aspects of ADL's at baseline. Her ct showed stercoral colitis which is likely improved with disimpaction but without another cuase for leukocytosis, fever, , tachy on arrival will tx w/ antibiotics, but otherwise stable for d/c with daughter at this time.   Discharge instructions, including strict return precautions for new or worsening symptoms, given. Patient and/or family verbalized understanding and agreement with the plan as described.   Labs, studies and imaging reviewed by myself and considered in medical decision making if ordered. Imaging interpreted by radiology.  Labs Reviewed  COMPREHENSIVE METABOLIC PANEL WITH GFR - Abnormal; Notable for the following components:      Result Value   Glucose, Bld 147 (*)    Calcium 8.5 (*)    All other components within normal limits  CBC WITH DIFFERENTIAL/PLATELET - Abnormal; Notable for the following components:   WBC 12.6 (*)    RBC 5.57 (*)    Hemoglobin 16.9 (*)    HCT 51.3 (*)    Neutro Abs 10.6 (*)    All other components within normal limits  CULTURE, BLOOD (ROUTINE X 2)  CULTURE, BLOOD (ROUTINE X 2)  LIPASE, BLOOD  LACTIC ACID, PLASMA  URINALYSIS, W/ REFLEX TO CULTURE (INFECTION SUSPECTED)    CT ABDOMEN PELVIS W CONTRAST  Final Result      No follow-ups on file.    Eve Hinders, MD 11/19/23 [phone]

## 2023-11-18 NOTE — ED Notes (Signed)
 Patient transported to CT

## 2023-11-18 NOTE — ED Provider Notes (Signed)
 [place] EMERGENCY DEPARTMENT AT [facility] Provider Note  CSN: [id number] Arrival date & time: 11/18/23 2017  Chief Complaint(s) Diarrhea and Weakness  HPI Latoya Long is a 89 y.o. female history of hypertension, chronic constipation presenting with diarrhea.  Patient's daughter reports patient had diarrhea starting today, associated decreased p.o. intake.  Patient's daughter gave her Imodium without improvement.  Also reports increasing weakness today.  Did not notice any fevers at home.  No nausea or vomiting.  No chest pain or abdominal pain.   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   HTN (hypertension) 04/28/2020   Rectal bleeding 04/28/2020   Home Medication(s) Prior to Admission medications   Not on File                                                                                                                                    Past Surgical History Past Surgical History:  Procedure Laterality Date   TONSILLECTOMY     Family History Family History  Problem Relation Age of Onset   Stroke Sister     Social History Social History   Tobacco Use   Smoking status: Never   Smokeless tobacco: Never  Substance Use Topics   Alcohol use: No   Drug use: No   Allergies Patient has no known allergies.  Review of Systems Review of Systems  All other systems reviewed and are negative.   Physical Exam Vital Signs  I have reviewed the triage vital signs BP (!) 161/71   Pulse 93   Temp (!) 100.4 F (38 C) (Rectal)   Resp (!) 23   SpO2 96%   Physical Exam Vitals and nursing note reviewed.  Constitutional:      General: She is not in acute distress.    Appearance: She is well-developed.  HENT:     Head: Normocephalic and atraumatic.     Mouth/Throat:     Mouth: Mucous membranes are moist.  Eyes:     Pupils: Pupils are equal, round, and reactive to light.  Cardiovascular:     Rate and Rhythm: Normal rate and regular rhythm.     Heart sounds: No murmur heard. Pulmonary:     Effort: Pulmonary effort is normal. No respiratory distress.     Breath sounds: Normal breath sounds.  Abdominal:     General: Abdomen is flat.     Palpations: Abdomen is soft.     Tenderness: There is abdominal tenderness (LLQ).  Musculoskeletal:  General: No tenderness.     Right lower leg: No edema.     Left lower leg: No edema.  Skin:    General: Skin is warm and dry.  Neurological:     General: No focal deficit present.     Mental Status: She is alert. Mental status is at baseline.  Psychiatric:        Mood and Affect: Mood normal.        Behavior: Behavior normal.     ED Results and Treatments Labs (all labs ordered are listed, but only abnormal results are displayed) Labs Reviewed  COMPREHENSIVE METABOLIC PANEL WITH GFR - Abnormal; Notable for the following components:      Result Value   Glucose, Bld 147 (*)    Calcium 8.5 (*)    All other components within normal limits  CBC WITH DIFFERENTIAL/PLATELET - Abnormal; Notable for the following components:   WBC 12.6 (*)    RBC 5.57 (*)    Hemoglobin 16.9 (*)    HCT 51.3 (*)    Neutro Abs 10.6 (*)    All other components within normal limits  CULTURE, BLOOD (ROUTINE X 2)  CULTURE, BLOOD (ROUTINE X 2)  LIPASE, BLOOD  LACTIC ACID, PLASMA  URINALYSIS, W/ REFLEX TO CULTURE (INFECTION SUSPECTED)  LACTIC ACID, PLASMA                                                                                                                          Radiology CT ABDOMEN PELVIS W  CONTRAST Result Date: 11/18/2023 CLINICAL DATA:  Left lower quadrant abdominal pain.  Diarrhea. EXAM: CT ABDOMEN AND PELVIS WITH CONTRAST TECHNIQUE: Multidetector CT imaging of the abdomen and pelvis was performed using the standard protocol following bolus administration of intravenous contrast. RADIATION DOSE REDUCTION: This exam was performed according to the departmental dose-optimization program which includes automated exposure control, adjustment of the mA and/or kV according to patient size and/or use of iterative reconstruction technique. CONTRAST:  OMNIPAQUE  IOHEXOL  300 MG/ML  SOLN COMPARISON:  04/28/2020 FINDINGS: Lower chest: Linear scarring or atelectasis in the lung bases. Hepatobiliary: No focal liver abnormality is seen. No gallstones, gallbladder wall thickening, or biliary dilatation. Pancreas: Unremarkable. No pancreatic ductal dilatation or surrounding inflammatory changes. Spleen: Normal in size without focal abnormality. Adrenals/Urinary Tract: Adrenal glands are unremarkable. Kidneys are normal, without renal calculi, focal lesion, or hydronephrosis. Bladder is unremarkable. Stomach/Bowel: Stomach, small bowel, and colon are not abnormally distended. Stool throughout the colon. Prominence of the stool-filled rectum with some rectal wall thickening and perirectal stranding possibly representing stercoral colitis. Appendix is normal. Vascular/Lymphatic: Aortic atherosclerosis. No enlarged abdominal or pelvic lymph nodes. Reproductive: Calcified uterine fibroid. No abnormal adnexal masses. Other: No free air or free fluid in the abdomen. Abdominal wall musculature appears intact. Musculoskeletal: Degenerative changes in the spine. Spondylolysis with mild spondylolisthesis at L5-S1. Old compression deformity of L1. IMPRESSION: 1. Stool-filled distended rectum with rectal wall thickening and perirectal fat stranding possibly  representing stercoral colitis. No evidence of proximal obstruction.  2. Aortic atherosclerosis. 3. Calcified uterine fibroid. Electronically Signed   By: Boyce Byes M.D.   On: 11/18/2023 22:35    Pertinent labs & imaging results that were available during my care of the patient were reviewed by me and considered in my medical decision making (see MDM for details).  Medications Ordered in ED Medications  acetaminophen  (OFIRMEV ) IV 1,000 mg (0 mg Intravenous Stopped 11/18/23 2206)  sodium chloride  0.9 % bolus 1,000 mL (1,000 mLs Intravenous New Bag/Given 11/18/23 2209)  iohexol  (OMNIPAQUE ) 300 MG/ML solution 100 mL (100 mLs Intravenous Contrast Given 11/18/23 2217)                                                                                                                                     Procedures Procedures  (including critical care time)  Medical Decision Making / ED Course   MDM:  89 year old presenting to the emergency department with diarrhea.  Patient overall well-appearing, vital signs notable for initially with mild tachycardia, improved on recheck.  Also with fever noted to 100.4.  Does have some left lower quadrant abdominal pain.  Given age, tenderness CT was obtained.  Does show sign of fecal impaction, suspect patient probably having overflow incontinence due to impaction.  Will attempt disimpaction.  Labs otherwise notable for mild leukocytosis, hemoconcentration likely due to dehydration.  Patient received fluids.  Did have fever, CT without other focal process.  Will reassess.      Additional history obtained: -Additional history obtained from {wsadditionalhistorian:28072} -External records from outside source obtained and reviewed including: Chart review including previous notes, labs, imaging, consultation notes including ***   Lab Tests: -I ordered, reviewed, and interpreted labs.   The pertinent results include:   Labs Reviewed  COMPREHENSIVE METABOLIC PANEL WITH GFR - Abnormal; Notable for the following components:       Result Value   Glucose, Bld 147 (*)    Calcium 8.5 (*)    All other components within normal limits  CBC WITH DIFFERENTIAL/PLATELET - Abnormal; Notable for the following components:   WBC 12.6 (*)    RBC 5.57 (*)    Hemoglobin 16.9 (*)    HCT 51.3 (*)    Neutro Abs 10.6 (*)    All other components within normal limits  CULTURE, BLOOD (ROUTINE X 2)  CULTURE, BLOOD (ROUTINE X 2)  LIPASE, BLOOD  LACTIC ACID, PLASMA  URINALYSIS, W/ REFLEX TO CULTURE (INFECTION SUSPECTED)  LACTIC ACID, PLASMA    Notable for ***  EKG   EKG Interpretation Date/Time:    Ventricular Rate:    PR Interval:    QRS Duration:    QT Interval:    QTC Calculation:   R Axis:      Text Interpretation:           Imaging Studies ordered: I ordered imaging studies including *** On my  interpretation imaging demonstrates *** I independently visualized and interpreted imaging. I agree with the radiologist interpretation   Medicines ordered and prescription drug management: Meds ordered this encounter  Medications   DISCONTD: acetaminophen  (TYLENOL ) tablet 1,000 mg   DISCONTD: acetaminophen  (OFIRMEV ) IV 1,000 mg    Is the patient UNABLE to take oral / enteral medications?:   Yes   DISCONTD: acetaminophen  (OFIRMEV ) IV 1,000 mg    Is the patient UNABLE to take oral / enteral medications?:   Yes   acetaminophen  (OFIRMEV ) IV 1,000 mg    Is the patient UNABLE to take oral / enteral medications?:   Yes   sodium chloride  0.9 % bolus 1,000 mL   iohexol  (OMNIPAQUE ) 300 MG/ML solution 100 mL    -I have reviewed the patients home medicines and have made adjustments as needed   Consultations Obtained: I requested consultation with the ***,  and discussed lab and imaging findings as well as pertinent plan - they recommend: ***   Cardiac Monitoring: The patient was maintained on a cardiac monitor.  I personally viewed and interpreted the cardiac monitored which showed an underlying rhythm of:  ***  Social Determinants of Health:  Diagnosis or treatment significantly limited by social determinants of health: {wssoc:28071}   Reevaluation: After the interventions noted above, I reevaluated the patient and found that their symptoms have {resolved/improved/worsened:23923::"improved"}  Co morbidities that complicate the patient evaluation  Past Medical History:  Diagnosis Date   Hemorrhoids    Hypertension       Dispostion: Disposition decision including need for hospitalization was considered, and patient {wsdispo:28070::"discharged from emergency department."}    Final Clinical Impression(s) / ED Diagnoses Final diagnoses:  None     This chart was dictated using voice recognition software.  Despite best efforts to proofread,  errors can occur which can change the documentation meaning.

## 2023-11-18 NOTE — ED Notes (Signed)
 MD assisted with disimpaction procedure. Pt tolerated well. Peri care and clean brief provided to pt. Family at bedside with call bell near to call when pt needs assistance to provide urine specimen.

## 2023-11-18 NOTE — ED Notes (Signed)
 ED Provider at bedside.

## 2023-11-19 LAB — URINALYSIS, W/ REFLEX TO CULTURE (INFECTION SUSPECTED)
Bilirubin Urine: NEGATIVE
Glucose, UA: NEGATIVE mg/dL
Hgb urine dipstick: NEGATIVE
Ketones, ur: 5 mg/dL — AB
Leukocytes,Ua: NEGATIVE
Nitrite: NEGATIVE
Protein, ur: NEGATIVE mg/dL
Specific Gravity, Urine: >1.046 — ABNORMAL HIGH (ref 1.005–1.030)
pH: 5 (ref 5.0–8.0)

## 2023-11-19 MED ORDER — DOCUSATE SODIUM 100 MG PO CAPS: 100.0000 mg | ORAL_CAPSULE | Freq: Two times a day (BID) | ORAL | 0 refills | Status: AC

## 2023-11-19 MED ORDER — AMOXICILLIN-POT CLAVULANATE 875-125 MG PO TABS: 1.0000 | ORAL_TABLET | Freq: Two times a day (BID) | ORAL | 0 refills | Status: AC

## 2023-11-20 LAB — BLOOD CULTURE ID PANEL (REFLEXED) - BCID2

## 2023-11-21 LAB — CULTURE, BLOOD (ROUTINE X 2)

## 2023-11-22 ENCOUNTER — Telehealth (HOSPITAL_BASED_OUTPATIENT_CLINIC_OR_DEPARTMENT_OTHER): Payer: Self-pay | Admitting: *Deleted

## 2023-11-22 NOTE — Telephone Encounter (Signed)
 Post ED Visit - Positive Culture Follow-up  Culture report reviewed by antimicrobial stewardship pharmacist: [facility] [x]  [place], [place].D. []  Skeet Duke, [address].D., BCPS AQ-ID []  Leslee Rase, Pharm.D., BCPS []  Garland Junk, [address].D., BCPS []  [place], [address].D., BCPS, AAHIVP []  Alcide Aly, Pharm.D., BCPS, AAHIVP []  Jerri Morale, PharmD, BCPS []  Graham Laws, PharmD, BCPS []  Cleda Curly, PharmD, BCPS []  Tamar Fairly, PharmD []  Ballard Levels, PharmD, BCPS []  Ollen Beverage, PharmD  [facility] []  Arlyne Bering, PharmD []  Sherryle Don, PharmD []  [place]elin[place], PharmD []  Delila Felty, Rph []  [place]) Cleora Daft, PharmD []  Augustina Block, PharmD []  Arie Kurtz, PharmD []  Sharlyn Deaner, PharmD []  Agnes Hose, PharmD []  Kendall Pauls, PharmD []  Gladstone Lamer, PharmD []  Armanda Bern, PharmD []  Tera Fellows, PharmD   Positive blood culture Treated with amoxicillin , organism sensitive to the same and no further patient follow-up is required at this time. Patient daughter called and patient is improving. Daughter denies any fever, fatigue, SOB, dizziness or lost of appepite, Advise to continuie the course of antibiotics and if patient starts to deteriate and show any symtoms of not improving to bring he to ED for repeat cultures and antibiotic treatment.   Lovell Rubenstein Sharion Davidson 11/22/2023, 3:04 PM

## 2023-11-22 NOTE — Telephone Encounter (Deleted)
 Post ED Visit - Positive Culture Follow-up: Unsuccessful Patient Follow-up  Culture assessed and recommendations reviewed by:  [x]  Terre Ferri, Pharm.D. []  Skeet Duke, Pharm.D., BCPS AQ-ID []  Leslee Rase, Pharm.D., BCPS []  Garland Junk, Pharm.D., BCPS []  [place], [address].D., BCPS, AAHIVP []  Alcide Aly, Pharm.D., BCPS, AAHIVP []  Alejo Hurter, PharmD []  Thomasine Flick, PharmD, BCPS  Positive blood culture  []  Patient discharged without antimicrobial prescription and treatment is now indicated []  Organism is resistant to prescribed ED discharge antimicrobial [x]  Patient with positive blood cultures  Calling to do symptom check. If patient  still having symptoms she may need a broader coverage.  If she has worsening I would have her come in to the hospital for re-evaluation since the blood culture has mecA resistance and she may require some IV antibiotics and repeat blood cultures. The organism that was found was only in 1 bottle of 4 and is a common contaminant-Rachael Rance Burrows  Unable to contact patient after 3 attempts, letter will be sent to address on file  Jessee Mormon 11/22/2023, 2:51 PM

## 2023-11-23 LAB — CULTURE, BLOOD (ROUTINE X 2): Culture: NO GROWTH
